# Patient Record
Sex: Female | Born: 1965 | Race: White | Hispanic: No | Marital: Married | Smoking: Never smoker
Health system: Southern US, Community
[De-identification: ages and names within clinical notes are randomized; demographics above are authoritative.]

## PROBLEM LIST (undated history)

## (undated) DIAGNOSIS — F32A Depression, unspecified: Secondary | ICD-10-CM

## (undated) DIAGNOSIS — M1990 Unspecified osteoarthritis, unspecified site: Secondary | ICD-10-CM

## (undated) DIAGNOSIS — K219 Gastro-esophageal reflux disease without esophagitis: Secondary | ICD-10-CM

## (undated) DIAGNOSIS — M353 Polymyalgia rheumatica: Secondary | ICD-10-CM

## (undated) DIAGNOSIS — R519 Headache, unspecified: Secondary | ICD-10-CM

## (undated) DIAGNOSIS — R51 Headache: Secondary | ICD-10-CM

## (undated) DIAGNOSIS — F329 Major depressive disorder, single episode, unspecified: Secondary | ICD-10-CM

## (undated) HISTORY — PX: OTHER SURGICAL HISTORY: SHX169

## (undated) HISTORY — PX: ABDOMINAL HYSTERECTOMY: SHX81

## (undated) HISTORY — PX: CHOLECYSTECTOMY: SHX55

---

## 1998-01-20 ENCOUNTER — Other Ambulatory Visit: Admission: RE | Admit: 1998-01-20 | Discharge: 1998-01-20 | Payer: Self-pay | Admitting: *Deleted

## 1998-12-29 ENCOUNTER — Other Ambulatory Visit: Admission: RE | Admit: 1998-12-29 | Discharge: 1998-12-29 | Payer: Self-pay | Admitting: *Deleted

## 2002-07-25 ENCOUNTER — Other Ambulatory Visit: Admission: RE | Admit: 2002-07-25 | Discharge: 2002-07-25 | Payer: Self-pay | Admitting: Obstetrics and Gynecology

## 2005-02-09 ENCOUNTER — Ambulatory Visit (HOSPITAL_COMMUNITY): Admission: RE | Admit: 2005-02-09 | Discharge: 2005-02-09 | Payer: Self-pay | Admitting: Family Medicine

## 2005-02-12 ENCOUNTER — Ambulatory Visit (HOSPITAL_COMMUNITY): Admission: RE | Admit: 2005-02-12 | Discharge: 2005-02-12 | Payer: Self-pay | Admitting: Family Medicine

## 2007-05-19 ENCOUNTER — Ambulatory Visit (HOSPITAL_COMMUNITY): Admission: RE | Admit: 2007-05-19 | Discharge: 2007-05-19 | Payer: Self-pay | Admitting: Obstetrics & Gynecology

## 2007-08-13 ENCOUNTER — Emergency Department (HOSPITAL_COMMUNITY): Admission: EM | Admit: 2007-08-13 | Discharge: 2007-08-13 | Payer: Self-pay | Admitting: Emergency Medicine

## 2009-02-06 ENCOUNTER — Encounter: Admission: RE | Admit: 2009-02-06 | Discharge: 2009-02-06 | Payer: Self-pay | Admitting: Family Medicine

## 2009-02-11 ENCOUNTER — Encounter: Admission: RE | Admit: 2009-02-11 | Discharge: 2009-02-11 | Payer: Self-pay | Admitting: Family Medicine

## 2009-03-12 ENCOUNTER — Emergency Department (HOSPITAL_COMMUNITY): Admission: EM | Admit: 2009-03-12 | Discharge: 2009-03-13 | Payer: Self-pay | Admitting: Emergency Medicine

## 2009-03-25 ENCOUNTER — Ambulatory Visit (HOSPITAL_COMMUNITY): Admission: RE | Admit: 2009-03-25 | Discharge: 2009-03-25 | Payer: Self-pay | Admitting: Surgery

## 2010-08-05 LAB — CBC
HCT: 38.1 % (ref 36.0–46.0)
HCT: 35.1 % — ABNORMAL LOW (ref 36.0–46.0)
Hemoglobin: 12.8 g/dL (ref 12.0–15.0)
Hemoglobin: 11.8 g/dL — ABNORMAL LOW (ref 12.0–15.0)
MCHC: 33.6 g/dL (ref 30.0–36.0)
MCHC: 33.5 g/dL (ref 30.0–36.0)
MCV: 84 fL (ref 78.0–100.0)
MCV: 84.1 fL (ref 78.0–100.0)
Platelets: 279 10*3/uL (ref 150–400)
Platelets: 253 10*3/uL (ref 150–400)
RBC: 4.53 MIL/uL (ref 3.87–5.11)
RBC: 4.18 MIL/uL (ref 3.87–5.11)
RDW: 13.3 % (ref 11.5–15.5)
RDW: 13.5 % (ref 11.5–15.5)
WBC: 8.7 10*3/uL (ref 4.0–10.5)
WBC: 8.6 10*3/uL (ref 4.0–10.5)

## 2010-08-05 LAB — COMPREHENSIVE METABOLIC PANEL
ALT: 43 U/L — ABNORMAL HIGH (ref 0–35)
ALT: 35 U/L (ref 0–35)
AST: 19 U/L (ref 0–37)
AST: 64 U/L — ABNORMAL HIGH (ref 0–37)
Albumin: 3.6 g/dL (ref 3.5–5.2)
Albumin: 3.1 g/dL — ABNORMAL LOW (ref 3.5–5.2)
Alkaline Phosphatase: 87 U/L (ref 39–117)
Alkaline Phosphatase: 69 U/L (ref 39–117)
BUN: 13 mg/dL (ref 6–23)
BUN: 9 mg/dL (ref 6–23)
CO2: 26 mEq/L (ref 19–32)
CO2: 28 mEq/L (ref 19–32)
Calcium: 9.4 mg/dL (ref 8.4–10.5)
Calcium: 8.3 mg/dL — ABNORMAL LOW (ref 8.4–10.5)
Chloride: 102 mEq/L (ref 96–112)
Chloride: 103 mEq/L (ref 96–112)
Creatinine, Ser: 0.64 mg/dL (ref 0.4–1.2)
Creatinine, Ser: 0.69 mg/dL (ref 0.4–1.2)
GFR calc Af Amer: >60 mL/min (ref >60)
GFR calc Af Amer: >60 mL/min (ref >60)
GFR calc non Af Amer: >60 mL/min (ref >60)
GFR calc non Af Amer: >60 mL/min (ref >60)
Glucose, Bld: 97 mg/dL (ref 70–99)
Glucose, Bld: 110 mg/dL — ABNORMAL HIGH (ref 70–99)
Potassium: 5.1 mEq/L (ref 3.5–5.1)
Potassium: 4.8 mEq/L (ref 3.5–5.1)
Sodium: 135 mEq/L (ref 135–145)
Sodium: 135 mEq/L (ref 135–145)
Total Bilirubin: 0.6 mg/dL (ref 0.3–1.2)
Total Bilirubin: 0.6 mg/dL (ref 0.3–1.2)
Total Protein: 7.3 g/dL (ref 6.0–8.3)
Total Protein: 6.9 g/dL (ref 6.0–8.3)

## 2010-08-05 LAB — DIFFERENTIAL
Basophils Absolute: 0 10*3/uL (ref 0.0–0.1)
Basophils Absolute: 0 10*3/uL (ref 0.0–0.1)
Basophils Relative: 0 % (ref 0–1)
Basophils Relative: 0 % (ref 0–1)
Eosinophils Absolute: 0.1 10*3/uL (ref 0.0–0.7)
Eosinophils Absolute: 0.1 10*3/uL (ref 0.0–0.7)
Eosinophils Relative: 2 % (ref 0–5)
Eosinophils Relative: 1 % (ref 0–5)
Lymphocytes Relative: 30 % (ref 12–46)
Lymphocytes Relative: 26 % (ref 12–46)
Lymphs Abs: 2.6 10*3/uL (ref 0.7–4.0)
Lymphs Abs: 2.2 10*3/uL (ref 0.7–4.0)
Monocytes Absolute: 0.5 10*3/uL (ref 0.1–1.0)
Monocytes Absolute: 0.6 10*3/uL (ref 0.1–1.0)
Monocytes Relative: 6 % (ref 3–12)
Monocytes Relative: 7 % (ref 3–12)
Neutro Abs: 5.5 10*3/uL (ref 1.7–7.7)
Neutro Abs: 5.6 10*3/uL (ref 1.7–7.7)
Neutrophils Relative %: 63 % (ref 43–77)
Neutrophils Relative %: 66 % (ref 43–77)

## 2010-08-05 LAB — LIPASE, BLOOD: Lipase: 18 U/L (ref 11–59)

## 2010-08-05 LAB — POCT CARDIAC MARKERS
CKMB, poc: 1.3 ng/mL (ref 1.0–8.0)
Myoglobin, poc: 86.2 ng/mL (ref 12–200)
Troponin i, poc: <0.05 ng/mL (ref 0.00–0.09)

## 2011-01-26 LAB — CBC
HCT: 37.5
Hemoglobin: 12.7
MCHC: 33.8
MCV: 83.5
Platelets: 247
RBC: 4.49
RDW: 13.1
WBC: 8.6

## 2011-01-26 LAB — POCT CARDIAC MARKERS
CKMB, poc: <1 — ABNORMAL LOW
Myoglobin, poc: 51.1
Myoglobin, poc: 68.4
Operator id: 257131
Troponin i, poc: <0.05

## 2011-01-26 LAB — POCT I-STAT, CHEM 8
BUN: 13
Calcium, Ion: 1.21
Chloride: 102
Creatinine, Ser: 0.9
Glucose, Bld: 103 — ABNORMAL HIGH
HCT: 40
Hemoglobin: 13.6
Potassium: 3.8
Sodium: 137
TCO2: 28

## 2011-04-09 ENCOUNTER — Other Ambulatory Visit: Payer: Self-pay | Admitting: Family Medicine

## 2011-04-09 DIAGNOSIS — N644 Mastodynia: Secondary | ICD-10-CM

## 2011-04-22 ENCOUNTER — Ambulatory Visit
Admission: RE | Admit: 2011-04-22 | Discharge: 2011-04-22 | Disposition: A | Discharge status: Home / Self Care | Payer: BC Managed Care – PPO | Source: Ambulatory Visit | Attending: Family Medicine | Admitting: Family Medicine

## 2011-04-22 DIAGNOSIS — N644 Mastodynia: Secondary | ICD-10-CM

## 2011-10-07 ENCOUNTER — Other Ambulatory Visit: Payer: Self-pay | Admitting: Orthopaedic Surgery

## 2011-10-07 DIAGNOSIS — M542 Cervicalgia: Secondary | ICD-10-CM

## 2011-10-07 DIAGNOSIS — M549 Dorsalgia, unspecified: Secondary | ICD-10-CM

## 2011-10-09 ENCOUNTER — Ambulatory Visit
Admission: RE | Admit: 2011-10-09 | Discharge: 2011-10-09 | Disposition: A | Discharge status: Home / Self Care | Payer: BC Managed Care – PPO | Source: Ambulatory Visit | Attending: Orthopaedic Surgery | Admitting: Orthopaedic Surgery

## 2011-10-09 DIAGNOSIS — M549 Dorsalgia, unspecified: Secondary | ICD-10-CM

## 2011-10-09 DIAGNOSIS — M542 Cervicalgia: Secondary | ICD-10-CM

## 2013-01-21 ENCOUNTER — Encounter (HOSPITAL_COMMUNITY): Payer: Self-pay

## 2013-01-21 ENCOUNTER — Emergency Department (HOSPITAL_COMMUNITY)
Admission: EM | Admit: 2013-01-21 | Discharge: 2013-01-21 | Disposition: A | Discharge status: Home / Self Care | Payer: BC Managed Care – PPO | Attending: Emergency Medicine | Admitting: Emergency Medicine

## 2013-01-21 DIAGNOSIS — F3289 Other specified depressive episodes: Secondary | ICD-10-CM | POA: Insufficient documentation

## 2013-01-21 DIAGNOSIS — F329 Major depressive disorder, single episode, unspecified: Secondary | ICD-10-CM

## 2013-01-21 DIAGNOSIS — M353 Polymyalgia rheumatica: Secondary | ICD-10-CM | POA: Insufficient documentation

## 2013-01-21 HISTORY — DX: Depression, unspecified: F32.A

## 2013-01-21 HISTORY — DX: Major depressive disorder, single episode, unspecified: F32.9

## 2013-01-21 HISTORY — DX: Polymyalgia rheumatica: M35.3

## 2013-01-21 LAB — COMPREHENSIVE METABOLIC PANEL
ALT: 11 U/L (ref 0–35)
Albumin: 3.4 g/dL — ABNORMAL LOW (ref 3.5–5.2)
Alkaline Phosphatase: 85 U/L (ref 39–117)
Potassium: 3.8 mEq/L (ref 3.5–5.1)
Sodium: 137 mEq/L (ref 135–145)
Total Protein: 8.3 g/dL (ref 6.0–8.3)

## 2013-01-21 LAB — RAPID URINE DRUG SCREEN, HOSP PERFORMED
Amphetamines: POSITIVE — AB
Barbiturates: NOT DETECTED
Tetrahydrocannabinol: NOT DETECTED

## 2013-01-21 LAB — CBC WITH DIFFERENTIAL/PLATELET
Basophils Relative: 0 % (ref 0–1)
Eosinophils Absolute: 0.1 10*3/uL (ref 0.0–0.7)
Lymphs Abs: 2.2 10*3/uL (ref 0.7–4.0)
MCH: 26.8 pg (ref 26.0–34.0)
Neutro Abs: 6.5 10*3/uL (ref 1.7–7.7)
Neutrophils Relative %: 71 % (ref 43–77)
Platelets: 244 10*3/uL (ref 150–400)
RBC: 4.66 MIL/uL (ref 3.87–5.11)

## 2013-01-21 LAB — ETHANOL: Alcohol, Ethyl (B): <11 mg/dL (ref 0–11)

## 2013-01-21 MED ORDER — ONDANSETRON HCL 4 MG PO TABS: 4.0000 mg | ORAL_TABLET | Freq: Three times a day (TID) | ORAL | Status: DC | PRN

## 2013-01-21 MED ORDER — IBUPROFEN 200 MG PO TABS: 600.0000 mg | ORAL_TABLET | Freq: Three times a day (TID) | ORAL | Status: DC | PRN

## 2013-01-21 MED ORDER — ZOLPIDEM TARTRATE 5 MG PO TABS: 5.0000 mg | ORAL_TABLET | Freq: Every evening | ORAL | Status: DC | PRN

## 2013-01-21 MED ORDER — ALUM & MAG HYDROXIDE-SIMETH 200-200-20 MG/5ML PO SUSP: 30.0000 mL | ORAL | Status: DC | PRN

## 2013-01-21 MED ORDER — ACETAMINOPHEN 325 MG PO TABS: 650.0000 mg | ORAL_TABLET | ORAL | Status: DC | PRN

## 2013-01-21 MED ORDER — NICOTINE 21 MG/24HR TD PT24: 21.0000 mg | MEDICATED_PATCH | Freq: Every day | TRANSDERMAL | Status: DC

## 2013-01-21 NOTE — ED Provider Notes (Addendum)
CSN: [id number]     Arrival date & time 01/21/13  1706 History  This chart was scribed for Hannah Pel, PA, working with Hannah Most B. Bernette Mayers, MD by Blanchard Kelch, ED Scribe. This patient was seen in room [facility] and the patient's care was started at 6:47 PM.    Chief Complaint  Patient presents with  . Mental Health Problem    The history is provided by the patient. No language interpreter was used.    HPI Comments: Hannah Lin is a 47 y.o. female who presents to the Emergency Department complaining of moderate, intermittent chronic depression. She states that the depression is due to family issues. She denies any suicidal or homicidal ideations. She denies any alcohol or drug use. She has a past medical history of polymyalgia rheumatica. She was on medication for it but stopped taking it because she believes it caused her to gain weight. She currently takes Adderall.    Past Medical History  Diagnosis Date  . Polymyalgia rheumatica   . Depressed    Past Surgical History  Procedure Laterality Date  . Cholecystectomy     History reviewed. No pertinent family history. History  Substance Use Topics  . Smoking status: Never Smoker   . Smokeless tobacco: Not on file  . Alcohol Use: No   OB History   Grav Para Term Preterm Abortions TAB SAB Ect Mult Living                 Review of Systems  Psychiatric/Behavioral: Negative for suicidal ideas.       Positive for depression  All other systems reviewed and are negative.    Allergies  Sulfa antibiotics  Home Medications  No current outpatient prescriptions on file. Triage Vitals: BP 116/67  Pulse 82  Temp(Src) 98.3 F (36.8 C) (Oral)  Resp 18  SpO2 100%  Physical Exam  Nursing note and vitals reviewed. Constitutional: She is oriented to person, place, and time. She appears well-developed and well-nourished. No distress.  HENT:  Head: Normocephalic and atraumatic.  Eyes: EOM are normal.  Neck: Neck supple. No  tracheal deviation present.  Cardiovascular: Normal rate, regular rhythm and normal heart sounds.   Pulmonary/Chest: Effort normal and breath sounds normal. No respiratory distress.  Musculoskeletal: Normal range of motion.  Neurological: She is alert and oriented to person, place, and time.  Skin: Skin is warm and dry.  Psychiatric: She has a normal mood and affect. Her behavior is normal.    ED Course  Procedures (including critical care time)  DIAGNOSTIC STUDIES: Oxygen Saturation is 100% on room air, normal by my interpretation.    COORDINATION OF CARE:  6:51 PM - Patient verbalizes understanding and agrees with treatment plan.   Labs Review Labs Reviewed  COMPREHENSIVE METABOLIC PANEL - Abnormal; Notable for the following:    Glucose, Bld 124 (*)    Albumin 3.4 (*)    Total Bilirubin 0.2 (*)    All other components within normal limits  CBC WITH DIFFERENTIAL  ETHANOL  URINE RAPID DRUG SCREEN (HOSP PERFORMED)   Imaging Review No results found.  MDM   1. Depression    TTS consulted to  evaluate patient. Labs ordered  Patient found to be safe to go home today from the ER. There were concerns about pt now admitting because of family members encouragement that she had taken 10 x 0.25mg  Clonazepam. Pt admits to this and denies taking anything else today that was irregular. She said she took  them all through out the day and ate with it as well. She admits that sometimes she will take 4 or 5 at a time for her nerves, and never has an adverse reaction. I explained to her that this is dangerous. She says he understands. Adamantly endorses she does not want to harm herself. Vital signs are stable. Pt is awake and alert and able to talk. TTS agrees patient can go home from psych perspective. Dr. Bernette Lin and myself agree.  47 y.o.Kyani J Lin's evaluation in the Emergency Department is complete. It has been determined that no acute conditions requiring further emergency  intervention are present at this time. The patient/guardian have been advised of the diagnosis and plan. We have discussed signs and symptoms that warrant return to the ED, such as changes or worsening in symptoms.  Vital signs are stable at discharge. Filed Vitals:   01/21/13 1855  BP: 101/67  Pulse: 69  Temp: 98 F (36.7 C)  Resp: 17    Patient/guardian has voiced understanding and agreed to follow-up with the PCP or specialist.   I personally performed the services described in this documentation, which was scribed in my presence. The recorded information has been reviewed and is accurate.    [name], PA-C 01/21/13 1903  [name], PA-C 01/21/13 2023

## 2013-01-21 NOTE — BH Assessment (Signed)
Assessment Note  Hannah Lin is an 47 y.o. female. Pt comes to [facility] on advice of her friend due to depression and an issue with her husband last night.  Husband reportedly drinks too much, argued with pt last night and then didn't come home.  Husband showed up today with a story about another woman and missing some money.  Pt also has pain issues that she deals with on a daily basis.  Pt admits to depression for some time.  Pt has tried anti depressant medication through PCP in the past, not currently taking anything.  Pt denies any other psych treatment.  Pt denies any SI/HIAV.  Pt did not mention that she had taken 10 clonazepam prior to coming to [facility] and pt did not mention this until ACT spoke with the friend.  Pt continues to deny any suicidal intent, states she took the pills to get some rest.  Pt reports 3 hours of sleep per night for quite some time.    Axis I: Depressive Disorder NOS Axis II: Deferred Axis III:  Past Medical History  Diagnosis Date  . Polymyalgia rheumatica   . Depressed    Axis IV: problems with primary support group Axis V: 51-60 moderate symptoms  Past Medical History:  Past Medical History  Diagnosis Date  . Polymyalgia rheumatica   . Depressed     Past Surgical History  Procedure Laterality Date  . Cholecystectomy      Family History: History reviewed. No pertinent family history.  Social History:  reports that she has never smoked. She does not have any smokeless tobacco history on file. She reports that she does not drink alcohol. Her drug history is not on file.  Additional Social History:  Alcohol / Drug Use Pain Medications: Pt denies.  BAC negative.  UDS + benzo and amphetamines. History of alcohol / drug use?: No history of alcohol / drug abuse  CIWA: CIWA-Ar BP: 101/67 mmHg Pulse Rate: 69 COWS:    Allergies:  Allergies  Allergen Reactions  . Sulfa Antibiotics Hives    Home Medications:  (Not in a hospital admission)  OB/GYN  Status:  No LMP recorded. Patient has had a hysterectomy.  General Assessment Data Location of Assessment: [facility] ED ACT Assessment: Yes Is this a Tele or Face-to-Face Assessment?: Face-to-Face Is this an Initial Assessment or a Re-assessment for this encounter?: Initial Assessment Living Arrangements: Spouse/significant other Can pt return to current living arrangement?: Yes     [facility] Crisis Care Plan Living Arrangements: Spouse/significant other Name of Psychiatrist: none Name of Therapist: none     Risk to self Suicidal Ideation: No Suicidal Intent: No Is patient at risk for suicide?: No Suicidal Plan?: No Access to Means: No What has been your use of drugs/alcohol within the last 12 months?: denies use Previous Attempts/Gestures: No Intentional Self Injurious Behavior: None Family Suicide History: No Recent stressful life event(s): Conflict (Comment) (with spouse, medical/pain issues) Persecutory voices/beliefs?: No Depression: Yes Depression Symptoms: Despondent;Insomnia;Tearfulness;Isolating;Fatigue;Guilt;Loss of interest in usual pleasures;Feeling worthless/self pity Substance abuse history and/or treatment for substance abuse?: No Suicide prevention information given to non-admitted patients: Yes  Risk to Others Homicidal Ideation: No Thoughts of Harm to Others: No Current Homicidal Intent: No Current Homicidal Plan: No Access to Homicidal Means: No History of harm to others?: No Assessment of Violence: None Noted Does patient have access to weapons?: No Criminal Charges Pending?: No Does patient have a court date: No  Psychosis Hallucinations: None noted Delusions: None noted  Mental Status Report Appear/Hygiene: Disheveled Eye Contact: Fair Motor Activity: Unremarkable Speech: Logical/coherent Level of Consciousness: Alert Mood: Other (Comment) (pleasant, cooperative) Affect: Appropriate to circumstance Anxiety Level: Minimal Thought Processes:  Coherent;Relevant Judgement: Unimpaired Orientation: Person;Place;Time;Situation Obsessive Compulsive Thoughts/Behaviors: None  Cognitive Functioning Concentration: Normal Memory: Recent Intact;Remote Intact IQ: Average Insight: Good Impulse Control: Fair Appetite: Good Weight Loss: 0 Weight Gain: 60 (in past 6 months) Sleep: No Change Total Hours of Sleep: 3 (long term sleep issues) Vegetative Symptoms: None  ADLScreening [facility] Assessment Services) Patient's cognitive ability adequate to safely complete daily activities?: Yes Patient able to express need for assistance with ADLs?: Yes Independently performs ADLs?: Yes (appropriate for developmental age)  Prior Inpatient Therapy Prior Inpatient Therapy: No  Prior Outpatient Therapy Prior Outpatient Therapy: No  ADL Screening (condition at time of admission) Patient's cognitive ability adequate to safely complete daily activities?: Yes Patient able to express need for assistance with ADLs?: Yes Independently performs ADLs?: Yes (appropriate for developmental age)       Abuse/Neglect Assessment (Assessment to be complete while patient is alone) Physical Abuse: Denies Verbal Abuse: Denies Sexual Abuse: Denies Exploitation of patient/patient's resources: Denies Self-Neglect: Denies Values / Beliefs Cultural Requests During Hospitalization: None Spiritual Requests During Hospitalization: None   Advance Directives (For Healthcare) Advance Directive: Patient does not have advance directive;Patient would not like information    Additional Information 1:1 In Past 12 Months?: No CIRT Risk: No Elopement Risk: No Does patient have medical clearance?: Yes     Disposition: Discussed this pt with Dr Effie Shy and then with PA, who will initiate further medical clearance at this time.  Prior to this, had discussed outpt treatment options with pt, who was amendable to pursuing outpt treatment for her  depression. Disposition Initial Assessment Completed for this Encounter: Yes  On Site Evaluation by:   Reviewed with Physician:    Lorri Frederick 01/21/2013 8:04 PM

## 2013-01-21 NOTE — ED Notes (Signed)
Patient presents slightly lethargic depressed mood and affect congruent to mood. Patient currently denies suicidality, homicidality or AVH. Patient admits to increased depression today after discovering that her husband gave money to a young woman who "shouldn't even have his number" money. Patient states that she feels neglected because her husband does not spend time at home and is now giving his time to someone else. She admits to taking approximately 10 tabs of her Klonopin from 0900 this AM until 1630 this evening. Patient states that she knows that it was stupid and that she believes in God and would never kill herself. She would never want her children to blame God or her husband for her death. Patient denies any current pain.

## 2013-01-21 NOTE — Progress Notes (Signed)
Admission is incomplete. Report was given to [facility]. Pt  Is a voluntary admission and denies any si/hi/av. She states she is depressed. Contributing factors are a alcoholic husband and a schizophrenic son. She was given fluids, refused any food and is resting in her room. She had no complaints of pain. Oncoming RN will finish the admission.

## 2013-01-21 NOTE — ED Notes (Signed)
I have just phoned report to [facility], Charity fundraiser in psych. E.D. And will transport her there shortly.

## 2013-01-21 NOTE — ED Provider Notes (Signed)
Medical screening examination/treatment/procedure(s) were performed by non-physician practitioner and as supervising physician I was immediately available for consultation/collaboration.   Charles B. Sheldon, MD 01/21/13 1929 

## 2013-01-21 NOTE — ED Notes (Signed)
She c/o being "very depressed for a long time now".  "I can't get any sleep--I only get about 2 hours of sleep a night".  She is in no distress.

## 2014-10-11 ENCOUNTER — Inpatient Hospital Stay (HOSPITAL_COMMUNITY)
Admission: EM | Admit: 2014-10-11 | Discharge: 2014-10-13 | DRG: 439 | Disposition: A | Discharge status: Home / Self Care | Payer: BLUE CROSS/BLUE SHIELD | Attending: Internal Medicine | Admitting: Internal Medicine

## 2014-10-11 ENCOUNTER — Emergency Department (HOSPITAL_COMMUNITY): Payer: BLUE CROSS/BLUE SHIELD

## 2014-10-11 ENCOUNTER — Encounter (HOSPITAL_COMMUNITY): Payer: Self-pay | Admitting: *Deleted

## 2014-10-11 DIAGNOSIS — Z6841 Body Mass Index (BMI) 40.0 and over, adult: Secondary | ICD-10-CM

## 2014-10-11 DIAGNOSIS — R1010 Upper abdominal pain, unspecified: Secondary | ICD-10-CM | POA: Diagnosis not present

## 2014-10-11 DIAGNOSIS — R52 Pain, unspecified: Secondary | ICD-10-CM | POA: Insufficient documentation

## 2014-10-11 DIAGNOSIS — K859 Acute pancreatitis without necrosis or infection, unspecified: Secondary | ICD-10-CM | POA: Diagnosis present

## 2014-10-11 DIAGNOSIS — Z9049 Acquired absence of other specified parts of digestive tract: Secondary | ICD-10-CM | POA: Diagnosis present

## 2014-10-11 DIAGNOSIS — M353 Polymyalgia rheumatica: Secondary | ICD-10-CM | POA: Diagnosis present

## 2014-10-11 LAB — CBC WITH DIFFERENTIAL/PLATELET
Basophils Absolute: 0 10*3/uL (ref 0.0–0.1)
Basophils Relative: 0 % (ref 0–1)
EOS ABS: 0.1 10*3/uL (ref 0.0–0.7)
EOS PCT: 1 % (ref 0–5)
HEMATOCRIT: 36.5 % (ref 36.0–46.0)
Hemoglobin: 11.4 g/dL — ABNORMAL LOW (ref 12.0–15.0)
LYMPHS ABS: 2.6 10*3/uL (ref 0.7–4.0)
LYMPHS PCT: 30 % (ref 12–46)
MCH: 26.9 pg (ref 26.0–34.0)
MCHC: 31.2 g/dL (ref 30.0–36.0)
MCV: 86.1 fL (ref 78.0–100.0)
MONO ABS: 0.5 10*3/uL (ref 0.1–1.0)
Monocytes Relative: 6 % (ref 3–12)
NEUTROS ABS: 5.6 10*3/uL (ref 1.7–7.7)
NEUTROS PCT: 63 % (ref 43–77)
Platelets: 268 10*3/uL (ref 150–400)
RBC: 4.24 MIL/uL (ref 3.87–5.11)
RDW: 14.3 % (ref 11.5–15.5)
WBC: 8.9 10*3/uL (ref 4.0–10.5)

## 2014-10-11 LAB — COMPREHENSIVE METABOLIC PANEL
ALT: 100 U/L — AB (ref 14–54)
AST: 179 U/L — AB (ref 15–41)
Albumin: 3.5 g/dL (ref 3.5–5.0)
Alkaline Phosphatase: 103 U/L (ref 38–126)
Anion gap: 8 (ref 5–15)
BUN: 20 mg/dL (ref 6–20)
CHLORIDE: 101 mmol/L (ref 101–111)
CO2: 29 mmol/L (ref 22–32)
Calcium: 8.9 mg/dL (ref 8.9–10.3)
Creatinine, Ser: 0.74 mg/dL (ref 0.44–1.00)
GFR calc non Af Amer: >60 mL/min (ref >60)
Glucose, Bld: 118 mg/dL — ABNORMAL HIGH (ref 65–99)
Potassium: 4.5 mmol/L (ref 3.5–5.1)
SODIUM: 138 mmol/L (ref 135–145)
TOTAL PROTEIN: 7.7 g/dL (ref 6.5–8.1)
Total Bilirubin: 1 mg/dL (ref 0.3–1.2)

## 2014-10-11 LAB — LIPASE, BLOOD: Lipase: >3000 U/L — ABNORMAL HIGH (ref 22–51)

## 2014-10-11 LAB — TROPONIN I: Troponin I: <0.03 ng/mL (ref <0.031)

## 2014-10-11 MED ORDER — IOHEXOL 300 MG/ML  SOLN
150.0000 mL | Freq: Once | INTRAMUSCULAR | Status: AC | PRN
  Administered 2014-10-11: 125 mL via INTRAVENOUS

## 2014-10-11 MED ORDER — IOHEXOL 300 MG/ML  SOLN
25.0000 mL | Freq: Once | INTRAMUSCULAR | Status: AC | PRN
  Administered 2014-10-11: 25 mL via ORAL

## 2014-10-11 MED ORDER — SODIUM CHLORIDE 0.9 % IV BOLUS (SEPSIS)
1000.0000 mL | Freq: Once | INTRAVENOUS | Status: AC
  Administered 2014-10-11: 1000 mL via INTRAVENOUS

## 2014-10-11 MED ORDER — HYDROMORPHONE HCL 1 MG/ML IJ SOLN
1.0000 mg | Freq: Once | INTRAMUSCULAR | Status: AC
  Administered 2014-10-11: 1 mg via INTRAVENOUS
  Filled 2014-10-11: qty 1

## 2014-10-11 MED ORDER — GI COCKTAIL ~~LOC~~
30.0000 mL | Freq: Once | ORAL | Status: AC
  Administered 2014-10-11: 30 mL via ORAL
  Filled 2014-10-11: qty 30

## 2014-10-11 NOTE — ED Provider Notes (Signed)
CSN: [id number]     Arrival date & time 10/11/14  1707 History  This chart was scribed for Hannah Berkshire, MD by Karle Plumber, ED Scribe. This patient was seen in room APA02/[facility] and the patient's care was started at 8:06 PM.  Chief Complaint  Patient presents with  . Chest Pain   Patient is a 49 y.o. female presenting with abdominal pain. The history is provided by the patient and medical records (the pt complains of abd pain for over a year.  worse with in the last week.). No language interpreter was used.  Abdominal Pain Pain location:  Epigastric Pain quality: not aching   Pain radiates to:  Epigastric region Pain severity:  Moderate Onset quality:  Gradual Timing:  Constant Progression:  Unchanged Chronicity:  New Context: not alcohol use   Associated symptoms: chest pain   Associated symptoms: no cough, no diarrhea, no fatigue and no hematuria     HPI Comments:  Hannah Lin is a 49 y.o. obese female with past surgical h/o cholecystectomy who presents to the Emergency Department complaining of severe upper abdominal pain that has been ongoing for "several years". She states the pain starts centrally and radiates upwards and into her back. She reports tightening around her ribs and pain into her back as well. She states she has fallen three times in the past two weeks. The first time she reports falling flat on her stomach and the second two times she reports falling on concrete. She denies being unsteady on her feet. She denies any alleviating factors of the abdominal pain but reports it worsens when she drinks milk. Pt states she has spoken with her PCP about the pain but has never been referred to a specialist or had a colonoscopy. She denies diarrhea or dizziness.   Past Medical History  Diagnosis Date  . Polymyalgia rheumatica   . Depressed    Past Surgical History  Procedure Laterality Date  . Cholecystectomy     No family history on file. History  Substance Use  Topics  . Smoking status: Never Smoker   . Smokeless tobacco: Not on file  . Alcohol Use: No   OB History    No data available     Review of Systems  Constitutional: Negative for appetite change and fatigue.  HENT: Negative for congestion, ear discharge and sinus pressure.   Eyes: Negative for discharge.  Respiratory: Negative for cough.   Cardiovascular: Positive for chest pain.  Gastrointestinal: Positive for abdominal pain. Negative for diarrhea.  Genitourinary: Negative for frequency and hematuria.  Musculoskeletal: Negative for back pain.  Skin: Negative for rash.  Neurological: Negative for dizziness, seizures and headaches.  Psychiatric/Behavioral: Negative for hallucinations.    Allergies  Sulfa antibiotics  Home Medications   Prior to Admission medications   Not on File   Triage Vitals: BP 125/67 mmHg  Pulse 95  Temp(Src) 98.5 F (36.9 C) (Oral)  Resp 18  Ht 5\' 6"  (1.676 m)  Wt 275 lb (124.739 kg)  BMI 44.41 kg/m2  SpO2 99% Physical Exam  Constitutional: She is oriented to person, place, and time. She appears well-developed.  HENT:  Head: Normocephalic.  Eyes: Conjunctivae and EOM are normal. No scleral icterus.  Neck: Neck supple. No thyromegaly present.  Cardiovascular: Normal rate and regular rhythm.  Exam reveals no gallop and no friction rub.   No murmur heard. Pulmonary/Chest: No stridor. She has no wheezes. She has no rales. She exhibits no tenderness.  Abdominal: Soft.  She exhibits no distension. There is tenderness (Moderate epigastric tenderness). There is no rebound.  Tender epigastric area  Musculoskeletal: Normal range of motion. She exhibits no edema.  Lymphadenopathy:    She has no cervical adenopathy.  Neurological: She is oriented to person, place, and time. She exhibits normal muscle tone. Coordination normal.  Skin: No rash noted. No erythema.  Psychiatric: She has a normal mood and affect. Her behavior is normal.    ED Course   Procedures (including critical care time) DIAGNOSTIC STUDIES: Oxygen Saturation is 99% on RA, normal by my interpretation.   COORDINATION OF CARE: 8:11 PM- Will order imaging. Pt verbalizes understanding and agrees to plan.  Medications - No data to display  Labs Review Labs Reviewed - No data to display  Imaging Review No results found.   EKG Interpretation   Date/Time:  Friday October 11 2014 17:17:22 EDT Ventricular Rate:  92 PR Interval:  152 QRS Duration: 84 QT Interval:  352 QTC Calculation: 435 R Axis:   56 Text Interpretation:  Normal sinus rhythm Normal ECG Confirmed by Winnie Barsky   MD, Nicie Milan [phone]) on 10/11/2014 8:01:06 PM      MDM   Final diagnoses:  None   pancreatitis  The chart was scribed for me under my direct supervision.  I personally performed the history, physical, and medical decision making and all procedures in the evaluation of this patient..  I personally performed the services described in this documentation, which was scribed in my presence. The recorded information has been reviewed and is accurate.    Hannah Berkshire, MD 10/11/14 2356

## 2014-10-11 NOTE — ED Notes (Signed)
Chest pain in epigastric region radiating into upper chest for over a year

## 2014-10-11 NOTE — ED Notes (Signed)
   10/11/14 1946  Abdominal  Gastrointestinal ([facility]) X  Abdomen Inspection Soft;Obese  Tenderness Nontender  Last Bowel Movement Date 10/10/14  Pt states on & off pain to her upper abdomen that come up to the right shoulder & back. Pt has become more consistent today. Pt states she has fallen 2 times over the past 2 weeks landing on her abdomen.

## 2014-10-11 NOTE — ED Notes (Addendum)
Pt states on & off pain to her upper abdomen that come up to the right shoulder & back. Pt has become more consistent today. Pt states she has fallen 2 times over the past 2 weeks landing on her abdomen.

## 2014-10-12 ENCOUNTER — Encounter (HOSPITAL_COMMUNITY): Payer: Self-pay | Admitting: *Deleted

## 2014-10-12 DIAGNOSIS — K859 Acute pancreatitis without necrosis or infection, unspecified: Secondary | ICD-10-CM | POA: Diagnosis present

## 2014-10-12 DIAGNOSIS — Z9049 Acquired absence of other specified parts of digestive tract: Secondary | ICD-10-CM | POA: Diagnosis present

## 2014-10-12 DIAGNOSIS — R52 Pain, unspecified: Secondary | ICD-10-CM

## 2014-10-12 DIAGNOSIS — K858 Other acute pancreatitis: Secondary | ICD-10-CM | POA: Diagnosis not present

## 2014-10-12 DIAGNOSIS — M353 Polymyalgia rheumatica: Secondary | ICD-10-CM | POA: Diagnosis present

## 2014-10-12 DIAGNOSIS — Z6841 Body Mass Index (BMI) 40.0 and over, adult: Secondary | ICD-10-CM | POA: Diagnosis not present

## 2014-10-12 DIAGNOSIS — R1010 Upper abdominal pain, unspecified: Secondary | ICD-10-CM | POA: Diagnosis present

## 2014-10-12 LAB — COMPREHENSIVE METABOLIC PANEL
ALK PHOS: 125 U/L (ref 38–126)
ALT: 169 U/L — ABNORMAL HIGH (ref 14–54)
AST: 244 U/L — ABNORMAL HIGH (ref 15–41)
Albumin: 3.2 g/dL — ABNORMAL LOW (ref 3.5–5.0)
Anion gap: 7 (ref 5–15)
BUN: 12 mg/dL (ref 6–20)
CALCIUM: 8.2 mg/dL — AB (ref 8.9–10.3)
CO2: 27 mmol/L (ref 22–32)
Chloride: 104 mmol/L (ref 101–111)
Creatinine, Ser: 0.59 mg/dL (ref 0.44–1.00)
Glucose, Bld: 101 mg/dL — ABNORMAL HIGH (ref 65–99)
POTASSIUM: 3.5 mmol/L (ref 3.5–5.1)
SODIUM: 138 mmol/L (ref 135–145)
TOTAL PROTEIN: 7.2 g/dL (ref 6.5–8.1)
Total Bilirubin: 1.3 mg/dL — ABNORMAL HIGH (ref 0.3–1.2)

## 2014-10-12 LAB — CBC WITH DIFFERENTIAL/PLATELET
Basophils Absolute: 0 10*3/uL (ref 0.0–0.1)
Basophils Relative: 0 % (ref 0–1)
Eosinophils Absolute: 0.1 10*3/uL (ref 0.0–0.7)
Eosinophils Relative: 1 % (ref 0–5)
HCT: 36.1 % (ref 36.0–46.0)
Hemoglobin: 11.1 g/dL — ABNORMAL LOW (ref 12.0–15.0)
LYMPHS PCT: 31 % (ref 12–46)
Lymphs Abs: 2.5 10*3/uL (ref 0.7–4.0)
MCH: 26.4 pg (ref 26.0–34.0)
MCHC: 30.7 g/dL (ref 30.0–36.0)
MCV: 86 fL (ref 78.0–100.0)
Monocytes Absolute: 0.4 10*3/uL (ref 0.1–1.0)
Monocytes Relative: 5 % (ref 3–12)
Neutro Abs: 5.2 10*3/uL (ref 1.7–7.7)
Neutrophils Relative %: 63 % (ref 43–77)
Platelets: 267 10*3/uL (ref 150–400)
RBC: 4.2 MIL/uL (ref 3.87–5.11)
RDW: 14.3 % (ref 11.5–15.5)
WBC: 8.2 10*3/uL (ref 4.0–10.5)

## 2014-10-12 LAB — LIPID PANEL
Cholesterol: 206 mg/dL — ABNORMAL HIGH (ref 0–200)
HDL: 60 mg/dL (ref >40)
LDL CALC: 123 mg/dL — AB (ref 0–99)
TRIGLYCERIDES: 113 mg/dL (ref <150)
Total CHOL/HDL Ratio: 3.4 RATIO
VLDL: 23 mg/dL (ref 0–40)

## 2014-10-12 LAB — LIPASE, BLOOD: Lipase: 1585 U/L — ABNORMAL HIGH (ref 22–51)

## 2014-10-12 MED ORDER — HYDROMORPHONE HCL 1 MG/ML IJ SOLN
0.5000 mg | INTRAMUSCULAR | Status: DC | PRN
  Administered 2014-10-12 – 2014-10-13 (×6): 0.5 mg via INTRAVENOUS
  Filled 2014-10-12 (×6): qty 1

## 2014-10-12 MED ORDER — CITALOPRAM HYDROBROMIDE 20 MG PO TABS
40.0000 mg | ORAL_TABLET | Freq: Every evening | ORAL | Status: DC
  Administered 2014-10-12: 40 mg via ORAL
  Filled 2014-10-12: qty 1
  Filled 2014-10-12: qty 2
  Filled 2014-10-12: qty 1

## 2014-10-12 MED ORDER — HEPARIN SODIUM (PORCINE) 5000 UNIT/ML IJ SOLN
5000.0000 [IU] | Freq: Three times a day (TID) | INTRAMUSCULAR | Status: DC
  Administered 2014-10-12 – 2014-10-13 (×4): 5000 [IU] via SUBCUTANEOUS
  Filled 2014-10-12 (×4): qty 1

## 2014-10-12 MED ORDER — PANTOPRAZOLE SODIUM 40 MG PO TBEC
40.0000 mg | DELAYED_RELEASE_TABLET | Freq: Every day | ORAL | Status: DC
  Administered 2014-10-12 – 2014-10-13 (×2): 40 mg via ORAL
  Filled 2014-10-12 (×2): qty 1

## 2014-10-12 MED ORDER — DEXTROSE-NACL 5-0.9 % IV SOLN
INTRAVENOUS | Status: DC
  Administered 2014-10-12: 13:00:00 via INTRAVENOUS
  Administered 2014-10-12: 100 mL/h via INTRAVENOUS

## 2014-10-12 MED ORDER — GABAPENTIN 100 MG PO CAPS
100.0000 mg | ORAL_CAPSULE | Freq: Every day | ORAL | Status: DC
  Administered 2014-10-12: 100 mg via ORAL
  Filled 2014-10-12 (×2): qty 1

## 2014-10-12 MED ORDER — SODIUM CHLORIDE 0.9 % IV SOLN
INTRAVENOUS | Status: DC
  Administered 2014-10-12: 1000 mL via INTRAVENOUS

## 2014-10-12 MED ORDER — CLONAZEPAM 0.5 MG PO TABS: 0.5000 mg | ORAL_TABLET | Freq: Three times a day (TID) | ORAL | Status: DC | PRN

## 2014-10-12 MED ORDER — GABAPENTIN 100 MG PO CAPS: 100.0000 mg | ORAL_CAPSULE | Freq: Every day | ORAL | Status: DC

## 2014-10-12 MED ORDER — AMPHETAMINE-DEXTROAMPHETAMINE 10 MG PO TABS
30.0000 mg | ORAL_TABLET | Freq: Two times a day (BID) | ORAL | Status: DC
  Filled 2014-10-12 (×2): qty 3

## 2014-10-12 MED ORDER — BUPROPION HCL ER (XL) 150 MG PO TB24
150.0000 mg | ORAL_TABLET | Freq: Every evening | ORAL | Status: DC
  Administered 2014-10-12: 150 mg via ORAL
  Filled 2014-10-12 (×2): qty 1

## 2014-10-12 NOTE — H&P (Signed)
Hospitalist Admission History and Physical  Patient name: Hannah Lin Medical record number: [id number] Date of birth: Jan 02, 1966 Age: 49 y.o. Gender: female  Primary Care Provider: No primary care provider on file.  Chief Complaint: pancreatitis   History of Present Illness:This is a 49 y.o. year old female with significant past medical history of morbid obesity, polymyalgia rheumatica presenting with pancreatitis. Pt states that she has had recurring intermittent abd pain for the past 2 years. States that she eats a high fat diet. Has recurrent episode of central/epigastric adb pain that radiates to back. Pain is usually moderate. However became acutely severe earlier today. Pt reports prior hx/p cholecystectomy in the past. No fevers or chills. No nausea or vomiting.  Presented to the ER Afebrile, hemodynamically stable. CBC WNL. CMET noted for elevated LFTs with AST 179, ALT 100. Lipase >3000. CT abd and pelvis show acute pancreatitis w/ mild general soft tissue inflammation of pancreas.   Assessment and Plan: Hannah Lin is a 49 y.o. year old female presenting with pancreatitis    Active Problems:   Pancreatitis   1-Pancreatitis -suspect likely diet induced  -lipid panel  -NPO--advance diet as tolerated  -IV Fluids  -Pain control  -anti-emetics -follow   2-Mood -Stable  -Cont home regimen   FEN/GI: NPO--advance diet as tolerated  Prophylaxis: sub q heparin Disposition: pending further evaluation Code Status:Full Code    Patient Active Problem List   Diagnosis Date Noted  . Pancreatitis 10/12/2014  . Polymyalgia rheumatica   . Depressed    Past Medical History: Past Medical History  Diagnosis Date  . Polymyalgia rheumatica   . Depressed     Past Surgical History: Past Surgical History  Procedure Laterality Date  . Cholecystectomy      Social History: History   Social History  . Marital Status: Married    Spouse Name: N/A  . Number of Children:  N/A  . Years of Education: N/A   Social History Main Topics  . Smoking status: Never Smoker   . Smokeless tobacco: Not on file  . Alcohol Use: No  . Drug Use: Not on file  . Sexual Activity: Not on file   Other Topics Concern  . None   Social History Narrative    Family History: No family history on file.  Allergies: Allergies  Allergen Reactions  . Sulfa Antibiotics Hives    Current Facility-Administered Medications  Medication Dose Route Frequency Provider Last Rate Last Dose  . 0.9 %  sodium chloride infusion   Intravenous Continuous Floydene Flock, MD      . heparin injection 5,000 Units  5,000 Units Subcutaneous 3 times per day Floydene Flock, MD      . HYDROmorphone (DILAUDID) injection 0.5 mg  0.5 mg Intravenous Q3H PRN Floydene Flock, MD       Current Outpatient Prescriptions  Medication Sig Dispense Refill  . amphetamine-dextroamphetamine (ADDERALL) 30 MG tablet Take 30 mg by mouth 2 (two) times daily.    Marland Kitchen buPROPion (WELLBUTRIN XL) 150 MG 24 hr tablet Take 150 mg by mouth every evening.    . citalopram (CELEXA) 40 MG tablet Take 40 mg by mouth every evening.    . clonazePAM (KLONOPIN) 0.5 MG tablet Take 0.5 mg by mouth 3 (three) times daily as needed for anxiety.    . gabapentin (NEURONTIN) 100 MG capsule Take 100-200 mg by mouth at bedtime.    . meloxicam (MOBIC) 15 MG tablet Take 15 mg by mouth  at bedtime.    Marland Kitchen omeprazole (PRILOSEC) 20 MG capsule Take 20 mg by mouth every morning.     Review Of Systems: Complete ROS negative except as noted above in HPI.  Physical Exam: Filed Vitals:   10/12/14 0106  BP: 133/89  Pulse: 77  Temp: 97.8 F (36.6 C)  Resp: 18    General: alert, cooperative and morbidly obese HEENT: PERRLA and extra ocular movement intact Heart: S1, S2 normal, no murmur, rub or gallop, regular rate and rhythm Lungs: clear to auscultation, no wheezes or rales and unlabored breathing Abdomen: obese abdomen, non tender, + bowel sounds   Extremities: extremities normal, atraumatic, no cyanosis or edema Skin:no rashes Neurology: normal without focal findings Psych: mood stable   Labs and Imaging: Lab Results  Component Value Date/Time   NA 138 10/11/2014 08:33 PM   K 4.5 10/11/2014 08:33 PM   CL 101 10/11/2014 08:33 PM   CO2 29 10/11/2014 08:33 PM   BUN 20 10/11/2014 08:33 PM   CREATININE 0.74 10/11/2014 08:33 PM   GLUCOSE 118* 10/11/2014 08:33 PM   Lab Results  Component Value Date   WBC 8.9 10/11/2014   HGB 11.4* 10/11/2014   HCT 36.5 10/11/2014   MCV 86.1 10/11/2014   PLT 268 10/11/2014    Ct Abdomen Pelvis W Contrast  10/12/2014   CLINICAL DATA:  Chronic severe upper abdominal pain, extending to the back. Status post fall. Initial encounter.  EXAM: CT ABDOMEN AND PELVIS WITH CONTRAST  TECHNIQUE: Multidetector CT imaging of the abdomen and pelvis was performed using the standard protocol following bolus administration of intravenous contrast.  CONTRAST:  OMNIPAQUE IOHEXOL 300 MG/ML  SOLN  COMPARISON:  CT of the abdomen and pelvis from 01/20/2006, abdominal ultrasound performed 03/13/2009, and MRI of the lumbar spine performed 10/09/2011  FINDINGS: Minimal bibasilar atelectasis is noted.  Mild soft tissue inflammation is noted about the entirety of the pancreas, concerning for acute pancreatitis. No significant free fluid is seen. There is no evidence of devascularization or pseudocyst formation at this time.  A 9 mm hypodensity is noted at the right hepatic lobe, only minimally changed in appearance from 2007 and likely benign. The liver and spleen are otherwise unremarkable. The patient is status post cholecystectomy, with clips noted at the gallbladder fossa. The adrenal glands are unremarkable.  The kidneys are unremarkable in appearance. There is no evidence of hydronephrosis. No renal or ureteral stones are seen. No perinephric stranding is appreciated.  The small bowel is unremarkable in appearance. The  stomach is within normal limits. No acute vascular abnormalities are seen.  The appendix is not definitely characterized; there is no evidence of appendicitis. The colon is largely decompressed and unremarkable in appearance.  The bladder is mildly distended and grossly unremarkable. A 1.8 cm cyst is noted at the remaining vaginal cuff, likely physiologic in nature. The patient is status post hysterectomy. No suspicious adnexal masses are seen. No inguinal lymphadenopathy is seen.  No acute osseous abnormalities are identified.  IMPRESSION: 1. Acute pancreatitis noted, with mild soft tissue inflammation about the entirety of the pancreas. No evidence of devascularization or pseudocyst formation at this time. 2. 9 mm hypodensity at the right hepatic lobe is only minimally changed from 2007 and is likely benign. 3. 1.8 cm cyst at the vaginal cuff is likely physiologic in nature.   Electronically Signed   By: Roanna Raider M.D.   On: 10/12/2014 00:15   Dg Abd Acute W/chest  10/11/2014  CLINICAL DATA:  Epigastric pain continuing into the chest, intermittent over the past year but today has been constant.  EXAM: DG ABDOMEN ACUTE W/ 1V CHEST  COMPARISON:  None.  FINDINGS: There is no evidence of dilated bowel loops or free intraperitoneal air. No radiopaque calculi or other significant radiographic abnormality is seen. Heart size and mediastinal contours are within normal limits. Both lungs are clear.  IMPRESSION: Negative abdominal radiographs.  No acute cardiopulmonary disease.   Electronically Signed   By: Ellery Plunk M.D.   On: 10/11/2014 21:39        Greater than 50% of greater than 70 minutes spent with patient in terms of direct patient care and/or care coordination.   Imaging studies and EKG readings personally reviewed    Doree Albee MD  Pager: [phone]

## 2014-10-12 NOTE — Progress Notes (Signed)
[facility] PROGRESS NOTE  Hannah Lin ZOX:[id number] DOB: March 02, 1966    PCP:   No primary care provider on file.   HPI: Hannah Lin is an 49 y.o. female with hx of PMR, s/p CCY, admitted yesterday with acute idiopathic pancreatitis, lipase >300, LFT only modestly elevated, and no evidence of infection. She had no known prior hx of pancreatitis.  She doesn't drink and doesn't take any diuretics.  Symptoms of intermittent constipation and diarrhea interpersed, with abdominal cramps is suspicious for IBS also.  She hadn't had colonoscopy in the past.   Rewiew of Systems:  Constitutional: Negative for malaise, fever and chills. No significant weight loss or weight gain Eyes: Negative for eye pain, redness and discharge, diplopia, visual changes, or flashes of light. ENMT: Negative for ear pain, hoarseness, nasal congestion, sinus pressure and sore throat. No headaches; tinnitus, drooling, or problem swallowing. Cardiovascular: Negative for chest pain, palpitations, diaphoresis, dyspnea and peripheral edema. ; No orthopnea, PND Respiratory: Negative for cough, hemoptysis, wheezing and stridor. No pleuritic chestpain. Gastrointestinal: Negative for  melena, blood in stool, hematemesis, jaundice and rectal bleeding.    Genitourinary: Negative for frequency, dysuria, incontinence,flank pain and hematuria; Musculoskeletal: Negative for back pain and neck pain. Negative for swelling and trauma.;  Skin: . Negative for pruritus, rash, abrasions, bruising and skin lesion.; ulcerations Neuro: Negative for headache, lightheadedness and neck stiffness. Negative for weakness, altered level of consciousness , altered mental status, extremity weakness, burning feet, involuntary movement, seizure and syncope.  Psych: negative for anxiety, depression, insomnia, tearfulness, panic attacks, hallucinations, paranoia, suicidal or homicidal ideation   Past Medical History  Diagnosis Date  . Polymyalgia  rheumatica   . Depressed     Past Surgical History  Procedure Laterality Date  . Cholecystectomy      Medications:  HOME MEDS: Prior to Admission medications   Medication Sig Start Date End Date Taking? Authorizing Provider  amphetamine-dextroamphetamine (ADDERALL) 30 MG tablet Take 30 mg by mouth 2 (two) times daily.   Yes Historical Provider, MD  buPROPion (WELLBUTRIN XL) 150 MG 24 hr tablet Take 150 mg by mouth every evening.   Yes Historical Provider, MD  citalopram (CELEXA) 40 MG tablet Take 40 mg by mouth every evening.   Yes Historical Provider, MD  clonazePAM (KLONOPIN) 0.5 MG tablet Take 0.5 mg by mouth 3 (three) times daily as needed for anxiety.   Yes Historical Provider, MD  gabapentin (NEURONTIN) 100 MG capsule Take 100-200 mg by mouth at bedtime.   Yes Historical Provider, MD  meloxicam (MOBIC) 15 MG tablet Take 15 mg by mouth at bedtime.   Yes Historical Provider, MD  omeprazole (PRILOSEC) 20 MG capsule Take 20 mg by mouth every morning.   Yes Historical Provider, MD     Allergies:  Allergies  Allergen Reactions  . Sulfa Antibiotics Hives    Social History:   reports that she has never smoked. She does not have any smokeless tobacco history on file. She reports that she does not drink alcohol. Her drug history is not on file.  Family History: History reviewed. No pertinent family history.   Physical Exam: Filed Vitals:   10/11/14 2200 10/11/14 2220 10/12/14 0106 10/12/14 0652  BP: 142/77 142/77 133/89 131/69  Pulse: 83 84 77 77  Temp:   97.8 F (36.6 C) 97.6 F (36.4 C)  TempSrc:   Oral Oral  Resp: Height:      Weight:    135.172 kg (  298 lb)  SpO2: 98% 98% 98% 97%   Blood pressure 131/69, pulse 77, temperature 97.6 F (36.4 C), temperature source Oral, resp. rate 20, height  (1.676 m), weight 135.172 kg (298 lb), SpO2 97 %.  GEN:  Pleasant  patient lying in the stretcher in no acute distress; cooperative with exam. PSYCH:  alert  and oriented x4; does not appear anxious or depressed; affect is appropriate. HEENT: Mucous membranes pink and anicteric; PERRLA; EOM intact; no cervical lymphadenopathy nor thyromegaly or carotid bruit; no JVD; There were no stridor. Neck is very supple. Breasts:: Not examined CHEST WALL: No tenderness CHEST: Normal respiration, clear to auscultation bilaterally.  HEART: Regular rate and rhythm.  There are no murmur, rub, or gallops.   BACK: No kyphosis or scoliosis; no CVA tenderness ABDOMEN: soft and non-tender; no masses, no organomegaly, normal abdominal bowel sounds; no pannus; no intertriginous candida. There is no rebound and no distention. Rectal Exam: Not done EXTREMITIES: No bone or joint deformity; age-appropriate arthropathy of the hands and knees; no edema; no ulcerations.  There is no calf tenderness. Genitalia: not examined PULSES: 2+ and symmetric SKIN: Normal hydration no rash or ulceration CNS: Cranial nerves 2-12 grossly intact no focal lateralizing neurologic deficit.  Speech is fluent; uvula elevated with phonation, facial symmetry and tongue midline. DTR are normal bilaterally, cerebella exam is intact, barbinski is negative and strengths are equaled bilaterally.  No sensory loss.   Labs on Admission:  Basic Metabolic Panel:  Recent Labs Lab 10/11/14 2033 10/12/14 0623  NA 138 138  K 4.5 3.5  CL 101 104  CO2 29 27  GLUCOSE 118* 101*  BUN 20 12  CREATININE 0.74 0.59  CALCIUM 8.9 8.2*   Liver Function Tests:  Recent Labs Lab 10/11/14 2033 10/12/14 0623  AST 179* 244*  ALT 100* 169*  ALKPHOS 103 125  BILITOT 1.0 1.3*  PROT 7.7 7.2  ALBUMIN 3.5 3.2*    Recent Labs Lab 10/11/14 2033  LIPASE >3000*   No results for input(s): AMMONIA in the last 168 hours. CBC:  Recent Labs Lab 10/11/14 2033 10/12/14 0623  WBC 8.9 8.2  NEUTROABS 5.6 5.2  HGB 11.4* 11.1*  HCT 36.5 36.1  MCV 86.1 86.0  PLT 268 267   Cardiac Enzymes:  Recent Labs Lab  10/11/14 2033  TROPONINI <0.03    CBG: No results for input(s): GLUCAP in the last 168 hours.   Radiological Exams on Admission: Ct Abdomen Pelvis W Contrast  10/12/2014   CLINICAL DATA:  Chronic severe upper abdominal pain, extending to the back. Status post fall. Initial encounter.  EXAM: CT ABDOMEN AND PELVIS WITH CONTRAST  TECHNIQUE: Multidetector CT imaging of the abdomen and pelvis was performed using the standard protocol following bolus administration of intravenous contrast.  CONTRAST:  OMNIPAQUE IOHEXOL 300 MG/ML  SOLN  COMPARISON:  CT of the abdomen and pelvis from 01/20/2006, abdominal ultrasound performed 03/13/2009, and MRI of the lumbar spine performed 10/09/2011  FINDINGS: Minimal bibasilar atelectasis is noted.  Mild soft tissue inflammation is noted about the entirety of the pancreas, concerning for acute pancreatitis. No significant free fluid is seen. There is no evidence of devascularization or pseudocyst formation at this time.  A 9 mm hypodensity is noted at the right hepatic lobe, only minimally changed in appearance from 2007 and likely benign. The liver and spleen are otherwise unremarkable. The patient is status post cholecystectomy, with clips noted at the gallbladder fossa. The adrenal glands are unremarkable.  The kidneys are unremarkable in appearance. There is no evidence of hydronephrosis. No renal or ureteral stones are seen. No perinephric stranding is appreciated.  The small bowel is unremarkable in appearance. The stomach is within normal limits. No acute vascular abnormalities are seen.  The appendix is not definitely characterized; there is no evidence of appendicitis. The colon is largely decompressed and unremarkable in appearance.  The bladder is mildly distended and grossly unremarkable. A 1.8 cm cyst is noted at the remaining vaginal cuff, likely physiologic in nature. The patient is status post hysterectomy. No suspicious adnexal masses are seen. No  inguinal lymphadenopathy is seen.  No acute osseous abnormalities are identified.  IMPRESSION: 1. Acute pancreatitis noted, with mild soft tissue inflammation about the entirety of the pancreas. No evidence of devascularization or pseudocyst formation at this time. 2. 9 mm hypodensity at the right hepatic lobe is only minimally changed from 2007 and is likely benign. 3. 1.8 cm cyst at the vaginal cuff is likely physiologic in nature.   Electronically Signed   By: Roanna Raider M.D.   On: 10/12/2014 00:15   Dg Abd Acute W/chest  10/11/2014   CLINICAL DATA:  Epigastric pain continuing into the chest, intermittent over the past year but today has been constant.  EXAM: DG ABDOMEN ACUTE W/ 1V CHEST  COMPARISON:  None.  FINDINGS: There is no evidence of dilated bowel loops or free intraperitoneal air. No radiopaque calculi or other significant radiographic abnormality is seen. Heart size and mediastinal contours are within normal limits. Both lungs are clear.  IMPRESSION: Negative abdominal radiographs.  No acute cardiopulmonary disease.   Electronically Signed   By: Ellery Plunk M.D.   On: 10/11/2014 21:39   Assessment/Plan Present on Admission:  . Pancreatitis   PLAN: Will continue with IVF, start clear liquid today.  Continue with pain meds.    Other plans as per orders.  Code Status: FULL Unk Lightning, MD. [facility] Pager [phone] 7pm to 7am.  10/12/2014, 11:59 AM

## 2014-10-13 DIAGNOSIS — K858 Other acute pancreatitis: Secondary | ICD-10-CM

## 2014-10-13 LAB — COMPREHENSIVE METABOLIC PANEL
ALT: 130 U/L — AB (ref 14–54)
AST: 91 U/L — AB (ref 15–41)
Albumin: 3.1 g/dL — ABNORMAL LOW (ref 3.5–5.0)
Alkaline Phosphatase: 129 U/L — ABNORMAL HIGH (ref 38–126)
Anion gap: 7 (ref 5–15)
BILIRUBIN TOTAL: 0.5 mg/dL (ref 0.3–1.2)
BUN: 6 mg/dL (ref 6–20)
CO2: 28 mmol/L (ref 22–32)
CREATININE: 0.65 mg/dL (ref 0.44–1.00)
Calcium: 8.4 mg/dL — ABNORMAL LOW (ref 8.9–10.3)
Chloride: 104 mmol/L (ref 101–111)
GFR calc Af Amer: >60 mL/min (ref >60)
GFR calc non Af Amer: >60 mL/min (ref >60)
GLUCOSE: 120 mg/dL — AB (ref 65–99)
Potassium: 3.8 mmol/L (ref 3.5–5.1)
Sodium: 139 mmol/L (ref 135–145)
TOTAL PROTEIN: 7 g/dL (ref 6.5–8.1)

## 2014-10-13 LAB — CBC WITH DIFFERENTIAL/PLATELET
BASOS PCT: 0 % (ref 0–1)
Basophils Absolute: 0 10*3/uL (ref 0.0–0.1)
EOS ABS: 0.1 10*3/uL (ref 0.0–0.7)
Eosinophils Relative: 1 % (ref 0–5)
HEMATOCRIT: 35.2 % — AB (ref 36.0–46.0)
Hemoglobin: 10.7 g/dL — ABNORMAL LOW (ref 12.0–15.0)
LYMPHS ABS: 2.3 10*3/uL (ref 0.7–4.0)
Lymphocytes Relative: 28 % (ref 12–46)
MCH: 26.4 pg (ref 26.0–34.0)
MCHC: 30.4 g/dL (ref 30.0–36.0)
MCV: 86.7 fL (ref 78.0–100.0)
MONO ABS: 0.5 10*3/uL (ref 0.1–1.0)
Monocytes Relative: 6 % (ref 3–12)
NEUTROS ABS: 5.3 10*3/uL (ref 1.7–7.7)
Neutrophils Relative %: 65 % (ref 43–77)
PLATELETS: 215 10*3/uL (ref 150–400)
RBC: 4.06 MIL/uL (ref 3.87–5.11)
RDW: 14.3 % (ref 11.5–15.5)
WBC: 8.1 10*3/uL (ref 4.0–10.5)

## 2014-10-13 LAB — LIPASE, BLOOD: Lipase: 188 U/L — ABNORMAL HIGH (ref 22–51)

## 2014-10-13 NOTE — Discharge Summary (Signed)
Physician Discharge Summary  AVANNAH DECKER EAV:[id number] DOB: 05-30-1965 DOA: 10/11/2014  PCP: None.   Admit date: 10/11/2014 Discharge date: 10/13/2014  Time spent: 45 minutes  Recommendations for Outpatient Follow-up:  1. Follow up with a local Internal medicine or PCP physician next week.    Discharge Diagnoses:  Active Problems:   Pancreatitis   Pain   Discharge Condition: Able to eat.  No abdominal pain, nausea, or vomiting.   Diet recommendation: Low fat diet.   Filed Weights   10/11/14 1720 10/11/14 1722 10/12/14 0652  Weight: 124.739 kg (275 lb) 124.739 kg (275 lb) 135.172 kg (298 lb)    History of present illness: Patient was admitted for acute pancreatitis by Dr. Alvester Morin on October 12, 2014.  As per his H and P:  " This is a 49 y.o. year old female with significant past medical history of morbid obesity, polymyalgia rheumatica presenting with pancreatitis. Pt states that she has had recurring intermittent abd pain for the past 2 years. States that she eats a high fat diet. Has recurrent episode of central/epigastric adb pain that radiates to back. Pain is usually moderate. However became acutely severe earlier today. Pt reports prior hx/p cholecystectomy in the past. No fevers or chills. No nausea or vomiting.  Presented to the ER Afebrile, hemodynamically stable. CBC WNL. CMET noted for elevated LFTs with AST 179, ALT 100. Lipase >3000. CT abd and pelvis show acute pancreatitis w/ mild general soft tissue inflammation of pancreas.   Hospital Course: Patient was admitted for acute pancreatitis not felt to be caused by alcohol or medication.  She had no pattern of biliary obstruction, and is s/p CCY.  It was likely to be idiopathic.  She was given bowel rest, IVF, and IV pain meds.  She improved quickly and her Lipase went from over 3000 to less than 200.  She was given clear liquid, and did well without abdominal pain, nausea or vomiting, and will advance her diet today.  She already  had a normal bowel movement.  She will be discharge home if she can tolerate her diet.  IVF was discontinued.  She will follow up with her PCP in one week.  She will avoid fatty food.  Thank you for allowing me to participate in her care.    Consultations:  NONE>   Discharge Exam: Filed Vitals:   10/13/14 0630  BP: 130/67  Pulse: 74  Temp: 98.3 F (36.8 C)  Resp: 20    General: appears well.  Cardiovascular: S1S2 Respiratory: Clear.   Discharge Instructions   Discharge Instructions    Diet - low sodium heart healthy    Complete by:  As directed      Discharge instructions    Complete by:  As directed   Avoid fatty food.  Follow up with your doctor in about a week.  Take your medication as directed.          Current Discharge Medication List    CONTINUE these medications which have NOT CHANGED   Details  amphetamine-dextroamphetamine (ADDERALL) 30 MG tablet Take 30 mg by mouth 2 (two) times daily.    buPROPion (WELLBUTRIN XL) 150 MG 24 hr tablet Take 150 mg by mouth every evening.    citalopram (CELEXA) 40 MG tablet Take 40 mg by mouth every evening.    clonazePAM (KLONOPIN) 0.5 MG tablet Take 0.5 mg by mouth 3 (three) times daily as needed for anxiety.    gabapentin (NEURONTIN) 100 MG capsule Take  100-200 mg by mouth at bedtime.    omeprazole (PRILOSEC) 20 MG capsule Take 20 mg by mouth every morning.      STOP taking these medications     meloxicam (MOBIC) 15 MG tablet        Allergies  Allergen Reactions  . Sulfa Antibiotics Hives      The results of significant diagnostics from this hospitalization (including imaging, microbiology, ancillary and laboratory) are listed below for reference.    Significant Diagnostic Studies: Ct Abdomen Pelvis W Contrast  10/12/2014   CLINICAL DATA:  Chronic severe upper abdominal pain, extending to the back. Status post fall. Initial encounter.  EXAM: CT ABDOMEN AND PELVIS WITH CONTRAST  TECHNIQUE: Multidetector CT  imaging of the abdomen and pelvis was performed using the standard protocol following bolus administration of intravenous contrast.  CONTRAST:  OMNIPAQUE IOHEXOL 300 MG/ML  SOLN  COMPARISON:  CT of the abdomen and pelvis from 01/20/2006, abdominal ultrasound performed 03/13/2009, and MRI of the lumbar spine performed 10/09/2011  FINDINGS: Minimal bibasilar atelectasis is noted.  Mild soft tissue inflammation is noted about the entirety of the pancreas, concerning for acute pancreatitis. No significant free fluid is seen. There is no evidence of devascularization or pseudocyst formation at this time.  A 9 mm hypodensity is noted at the right hepatic lobe, only minimally changed in appearance from 2007 and likely benign. The liver and spleen are otherwise unremarkable. The patient is status post cholecystectomy, with clips noted at the gallbladder fossa. The adrenal glands are unremarkable.  The kidneys are unremarkable in appearance. There is no evidence of hydronephrosis. No renal or ureteral stones are seen. No perinephric stranding is appreciated.  The small bowel is unremarkable in appearance. The stomach is within normal limits. No acute vascular abnormalities are seen.  The appendix is not definitely characterized; there is no evidence of appendicitis. The colon is largely decompressed and unremarkable in appearance.  The bladder is mildly distended and grossly unremarkable. A 1.8 cm cyst is noted at the remaining vaginal cuff, likely physiologic in nature. The patient is status post hysterectomy. No suspicious adnexal masses are seen. No inguinal lymphadenopathy is seen.  No acute osseous abnormalities are identified.  IMPRESSION: 1. Acute pancreatitis noted, with mild soft tissue inflammation about the entirety of the pancreas. No evidence of devascularization or pseudocyst formation at this time. 2. 9 mm hypodensity at the right hepatic lobe is only minimally changed from 2007 and is likely benign. 3.  1.8 cm cyst at the vaginal cuff is likely physiologic in nature.   Electronically Signed   By: Roanna Raider M.D.   On: 10/12/2014 00:15   Dg Abd Acute W/chest  10/11/2014   CLINICAL DATA:  Epigastric pain continuing into the chest, intermittent over the past year but today has been constant.  EXAM: DG ABDOMEN ACUTE W/ 1V CHEST  COMPARISON:  None.  FINDINGS: There is no evidence of dilated bowel loops or free intraperitoneal air. No radiopaque calculi or other significant radiographic abnormality is seen. Heart size and mediastinal contours are within normal limits. Both lungs are clear.  IMPRESSION: Negative abdominal radiographs.  No acute cardiopulmonary disease.   Electronically Signed   By: Ellery Plunk M.D.   On: 10/11/2014 21:39    Microbiology: No results found for this or any previous visit (from the past 240 hour(s)).   Labs: Basic Metabolic Panel:  Recent Labs Lab 10/11/14 2033 10/12/14 0623 10/13/14 0610  NA 138 138 139  K  4.5 3.5 3.8  CL 101 104 104  CO2 GLUCOSE 118* 101* 120*  BUN CREATININE 0.74 0.59 0.65  CALCIUM 8.9 8.2* 8.4*   Liver Function Tests:  Recent Labs Lab 10/11/14 2033 10/12/14 0623 10/13/14 0610  AST 179* 244* 91*  ALT 100* 169* 130*  ALKPHOS 103 125 129*  BILITOT 1.0 1.3* 0.5  PROT 7.7 7.2 7.0  ALBUMIN 3.5 3.2* 3.1*    Recent Labs Lab 10/11/14 2033 10/12/14 0623 10/13/14 0610  LIPASE >3000* 1585* 188*   CBC:  Recent Labs Lab 10/11/14 2033 10/12/14 0623 10/13/14 0610  WBC 8.9 8.2 8.1  NEUTROABS 5.6 5.2 5.3  HGB 11.4* 11.1* 10.7*  HCT 36.5 36.1 35.2*  MCV 86.1 86.0 86.7  PLT 268 267 215   Cardiac Enzymes:  Recent Labs Lab 10/11/14 2033  TROPONINI <0.03    Signed:  Jhace Fennell  [facility] 10/13/2014, 11:29 AM

## 2014-10-13 NOTE — Progress Notes (Signed)
Pt's IV catheter removed and intact. Pt's IV site clean dry and intact. Discharge instructions and medications reviewed and discussed with patient. All questions answered and no further questions at this time. Pt escorted by nurse tech.

## 2014-10-16 ENCOUNTER — Inpatient Hospital Stay (HOSPITAL_COMMUNITY)
Admission: EM | Admit: 2014-10-16 | Discharge: 2014-10-20 | DRG: 439 | Disposition: A | Discharge status: Home / Self Care | Payer: BLUE CROSS/BLUE SHIELD | Attending: Family Medicine | Admitting: Family Medicine

## 2014-10-16 ENCOUNTER — Encounter (HOSPITAL_COMMUNITY): Payer: Self-pay | Admitting: Emergency Medicine

## 2014-10-16 DIAGNOSIS — K859 Acute pancreatitis without necrosis or infection, unspecified: Secondary | ICD-10-CM | POA: Diagnosis present

## 2014-10-16 DIAGNOSIS — K851 Biliary acute pancreatitis: Secondary | ICD-10-CM | POA: Diagnosis not present

## 2014-10-16 DIAGNOSIS — K8020 Calculus of gallbladder without cholecystitis without obstruction: Secondary | ICD-10-CM | POA: Diagnosis present

## 2014-10-16 DIAGNOSIS — F419 Anxiety disorder, unspecified: Secondary | ICD-10-CM | POA: Diagnosis present

## 2014-10-16 DIAGNOSIS — R7989 Other specified abnormal findings of blood chemistry: Secondary | ICD-10-CM | POA: Diagnosis present

## 2014-10-16 DIAGNOSIS — Z882 Allergy status to sulfonamides status: Secondary | ICD-10-CM

## 2014-10-16 DIAGNOSIS — M353 Polymyalgia rheumatica: Secondary | ICD-10-CM | POA: Diagnosis present

## 2014-10-16 DIAGNOSIS — Z79899 Other long term (current) drug therapy: Secondary | ICD-10-CM

## 2014-10-16 DIAGNOSIS — R1110 Vomiting, unspecified: Secondary | ICD-10-CM

## 2014-10-16 DIAGNOSIS — F329 Major depressive disorder, single episode, unspecified: Secondary | ICD-10-CM | POA: Diagnosis present

## 2014-10-16 DIAGNOSIS — K861 Other chronic pancreatitis: Secondary | ICD-10-CM | POA: Diagnosis present

## 2014-10-16 DIAGNOSIS — R748 Abnormal levels of other serum enzymes: Secondary | ICD-10-CM | POA: Diagnosis present

## 2014-10-16 DIAGNOSIS — Z6841 Body Mass Index (BMI) 40.0 and over, adult: Secondary | ICD-10-CM

## 2014-10-16 DIAGNOSIS — R945 Abnormal results of liver function studies: Secondary | ICD-10-CM

## 2014-10-16 DIAGNOSIS — H6690 Otitis media, unspecified, unspecified ear: Secondary | ICD-10-CM | POA: Diagnosis present

## 2014-10-16 LAB — COMPREHENSIVE METABOLIC PANEL
ALK PHOS: 253 U/L — AB (ref 38–126)
ALT: 213 U/L — ABNORMAL HIGH (ref 14–54)
AST: 246 U/L — ABNORMAL HIGH (ref 15–41)
Albumin: 3.5 g/dL (ref 3.5–5.0)
Anion gap: 10 (ref 5–15)
BUN: 6 mg/dL (ref 6–20)
CO2: 26 mmol/L (ref 22–32)
Calcium: 9.4 mg/dL (ref 8.9–10.3)
Chloride: 101 mmol/L (ref 101–111)
Creatinine, Ser: 0.85 mg/dL (ref 0.44–1.00)
GFR calc Af Amer: >60 mL/min (ref >60)
GFR calc non Af Amer: >60 mL/min (ref >60)
GLUCOSE: 121 mg/dL — AB (ref 65–99)
POTASSIUM: 4.2 mmol/L (ref 3.5–5.1)
Sodium: 137 mmol/L (ref 135–145)
Total Bilirubin: 1.6 mg/dL — ABNORMAL HIGH (ref 0.3–1.2)
Total Protein: 8.3 g/dL — ABNORMAL HIGH (ref 6.5–8.1)

## 2014-10-16 LAB — CBC WITH DIFFERENTIAL/PLATELET
BASOS ABS: 0 10*3/uL (ref 0.0–0.1)
BASOS PCT: 0 % (ref 0–1)
Eosinophils Absolute: 0.1 10*3/uL (ref 0.0–0.7)
Eosinophils Relative: 1 % (ref 0–5)
HEMATOCRIT: 38.4 % (ref 36.0–46.0)
HEMOGLOBIN: 12.2 g/dL (ref 12.0–15.0)
Lymphocytes Relative: 28 % (ref 12–46)
Lymphs Abs: 2.1 10*3/uL (ref 0.7–4.0)
MCH: 26.6 pg (ref 26.0–34.0)
MCHC: 31.8 g/dL (ref 30.0–36.0)
MCV: 83.7 fL (ref 78.0–100.0)
MONO ABS: 0.5 10*3/uL (ref 0.1–1.0)
Monocytes Relative: 7 % (ref 3–12)
NEUTROS PCT: 64 % (ref 43–77)
Neutro Abs: 4.7 10*3/uL (ref 1.7–7.7)
Platelets: 329 10*3/uL (ref 150–400)
RBC: 4.59 MIL/uL (ref 3.87–5.11)
RDW: 14.5 % (ref 11.5–15.5)
WBC: 7.4 10*3/uL (ref 4.0–10.5)

## 2014-10-16 LAB — GLUCOSE, CAPILLARY: Glucose-Capillary: 117 mg/dL — ABNORMAL HIGH (ref 65–99)

## 2014-10-16 LAB — LIPASE, BLOOD: LIPASE: 1056 U/L — AB (ref 22–51)

## 2014-10-16 LAB — AMYLASE: AMYLASE: 280 U/L — AB (ref 28–100)

## 2014-10-16 MED ORDER — BUPROPION HCL ER (XL) 150 MG PO TB24
150.0000 mg | ORAL_TABLET | Freq: Every evening | ORAL | Status: DC
  Administered 2014-10-17 – 2014-10-19 (×3): 150 mg via ORAL
  Filled 2014-10-16 (×4): qty 1

## 2014-10-16 MED ORDER — ONDANSETRON HCL 4 MG/2ML IJ SOLN
4.0000 mg | Freq: Four times a day (QID) | INTRAMUSCULAR | Status: DC | PRN
  Administered 2014-10-17: 4 mg via INTRAVENOUS
  Filled 2014-10-16: qty 2

## 2014-10-16 MED ORDER — ACETAMINOPHEN 650 MG RE SUPP: 650.0000 mg | Freq: Four times a day (QID) | RECTAL | Status: DC | PRN

## 2014-10-16 MED ORDER — HYDROMORPHONE HCL 1 MG/ML IJ SOLN: 0.5000 mg | Freq: Once | INTRAMUSCULAR | Status: DC

## 2014-10-16 MED ORDER — MORPHINE SULFATE 4 MG/ML IJ SOLN
4.0000 mg | Freq: Once | INTRAMUSCULAR | Status: AC
  Administered 2014-10-16: 4 mg via INTRAVENOUS
  Filled 2014-10-16: qty 1

## 2014-10-16 MED ORDER — CLONAZEPAM 0.5 MG PO TABS
0.5000 mg | ORAL_TABLET | Freq: Three times a day (TID) | ORAL | Status: DC | PRN
  Filled 2014-10-16: qty 1

## 2014-10-16 MED ORDER — SODIUM CHLORIDE 0.9 % IV BOLUS (SEPSIS)
1000.0000 mL | Freq: Once | INTRAVENOUS | Status: AC
  Administered 2014-10-16: 1000 mL via INTRAVENOUS

## 2014-10-16 MED ORDER — SODIUM CHLORIDE 0.9 % IV SOLN
INTRAVENOUS | Status: AC
Start: 2014-10-16 — End: 2014-10-17
  Administered 2014-10-16: via INTRAVENOUS

## 2014-10-16 MED ORDER — GABAPENTIN 100 MG PO CAPS
100.0000 mg | ORAL_CAPSULE | Freq: Every day | ORAL | Status: DC
  Administered 2014-10-17 – 2014-10-19 (×4): 100 mg via ORAL
  Filled 2014-10-16 (×6): qty 2

## 2014-10-16 MED ORDER — ONDANSETRON HCL 4 MG/2ML IJ SOLN
4.0000 mg | Freq: Once | INTRAMUSCULAR | Status: AC
  Administered 2014-10-16: 4 mg via INTRAVENOUS

## 2014-10-16 MED ORDER — ENOXAPARIN SODIUM 40 MG/0.4ML ~~LOC~~ SOLN
40.0000 mg | Freq: Every day | SUBCUTANEOUS | Status: DC
  Administered 2014-10-17 – 2014-10-20 (×4): 40 mg via SUBCUTANEOUS
  Filled 2014-10-16 (×4): qty 0.4

## 2014-10-16 MED ORDER — HYDROMORPHONE HCL 1 MG/ML IJ SOLN
1.0000 mg | INTRAMUSCULAR | Status: DC | PRN
  Administered 2014-10-16 – 2014-10-19 (×14): 1 mg via INTRAVENOUS
  Filled 2014-10-16 (×18): qty 1

## 2014-10-16 MED ORDER — CITALOPRAM HYDROBROMIDE 40 MG PO TABS
40.0000 mg | ORAL_TABLET | Freq: Every evening | ORAL | Status: DC
  Administered 2014-10-17 – 2014-10-19 (×3): 40 mg via ORAL
  Filled 2014-10-16 (×4): qty 1

## 2014-10-16 MED ORDER — ACETAMINOPHEN 325 MG PO TABS
650.0000 mg | ORAL_TABLET | Freq: Four times a day (QID) | ORAL | Status: DC | PRN
  Administered 2014-10-18 (×2): 650 mg via ORAL
  Filled 2014-10-16 (×3): qty 2

## 2014-10-16 MED ORDER — ONDANSETRON HCL 4 MG/2ML IJ SOLN
INTRAMUSCULAR | Status: AC
  Filled 2014-10-16: qty 2

## 2014-10-16 MED ORDER — ONDANSETRON HCL 4 MG PO TABS: 4.0000 mg | ORAL_TABLET | Freq: Four times a day (QID) | ORAL | Status: DC | PRN

## 2014-10-16 NOTE — ED Notes (Signed)
Pt sts hospitalized this weekend for pancreatitis and still having pain in upper abd area that is severe in nature

## 2014-10-16 NOTE — H&P (Signed)
[facility] History and Physical  Hannah Lin NAT:[id number] DOB: 06/10/1965 DOA: 10/16/2014  Referring physician: Ms.Marissa. PCP: Pamelia Hoit, MD  Specialists: None.  Chief Complaint: Abdominal pain.  HPI: Hannah Lin is a 49 y.o. female with history of recurrent pancreatitis who was recently admitted at [facility] for acute pancreatitis and just discharged 3 days ago presents to the ER because of severe abdominal pain. Patient is in the epigastric area radiating to the back stabbing in nature with some nausea denies any vomiting or diarrhea. Denies any chest pain or shortness of breath. In the ER patient's lipase and LFTs are elevated. Patient after her last discharge had appointment with Dr. Elnoria Howard gastroenterologist today and at the office patient started developing severe pain and was referred to the ER. Sonogram of the abdomen is pending. Patient denies having any new medications and denies taking alcohol.  Review of Systems: As presented in the history of presenting illness, rest negative.  Past Medical History  Diagnosis Date  . Polymyalgia rheumatica   . Depressed    Past Surgical History  Procedure Laterality Date  . Cholecystectomy     Social History:  reports that she has never smoked. She does not have any smokeless tobacco history on file. She reports that she does not drink alcohol. Her drug history is not on file. Where does patient live home.  Can patient participate in ADLs? Yes.  Allergies  Allergen Reactions  . Sulfa Antibiotics Hives    Family History:  Family History  Problem Relation Age of Onset  . Pancreatitis Neg Hx       Prior to Admission medications   Medication Sig Start Date End Date Taking? Authorizing Provider  amphetamine-dextroamphetamine (ADDERALL) 30 MG tablet Take 30 mg by mouth 2 (two) times daily.   Yes Historical Provider, MD  buPROPion (WELLBUTRIN XL) 150 MG 24 hr tablet Take 150 mg by mouth every evening.   Yes  Historical Provider, MD  citalopram (CELEXA) 40 MG tablet Take 40 mg by mouth every evening.   Yes Historical Provider, MD  clonazePAM (KLONOPIN) 0.5 MG tablet Take 0.5 mg by mouth 3 (three) times daily as needed for anxiety.   Yes Historical Provider, MD  gabapentin (NEURONTIN) 100 MG capsule Take 100-200 mg by mouth at bedtime.   Yes Historical Provider, MD  omeprazole (PRILOSEC) 20 MG capsule Take 20 mg by mouth every morning.   Yes Historical Provider, MD    Physical Exam: Filed Vitals:   10/16/14 1718 10/16/14 2207 10/16/14 2308  BP: 159/103 135/67 150/82  Pulse: 100 96 79  Temp: 98.1 F (36.7 C)  97.6 F (36.4 C)  TempSrc: Oral  Oral  Resp: 18 16 15   Height:   5\' 6"  (1.676 m)  Weight:   132.45 kg (292 lb)  SpO2: 95% 100% 99%     General:  Obese not in distress.   Eyes: Anicteric no pallor.   ENT: No discharge from ears eyes nose and mouth.   Neck: No mass felt.   Cardiovascular: S1-S2 heard.   Respiratory: No rhonchi or crepitations.   Abdomen: Soft nontender bowel sounds present.   Skin: No rash.   Musculoskeletal: No edema.   Psychiatric: Appears normal.  Neurologic: Alert awake oriented to time place and person. Moves all extremities.   Labs on Admission:  Basic Metabolic Panel:  Recent Labs Lab 10/11/14 2033 10/12/14 0623 10/13/14 0610 10/16/14 1722  NA 138 138 139 137  K 4.5 3.5 3.8  4.2  CL 101 104 104 101  CO2 GLUCOSE 118* 101* 120* 121*  BUN CREATININE 0.74 0.59 0.65 0.85  CALCIUM 8.9 8.2* 8.4* 9.4   Liver Function Tests:  Recent Labs Lab 10/11/14 2033 10/12/14 0623 10/13/14 0610 10/16/14 1722  AST 179* 244* 91* 246*  ALT 100* 169* 130* 213*  ALKPHOS 103 125 129* 253*  BILITOT 1.0 1.3* 0.5 1.6*  PROT 7.7 7.2 7.0 8.3*  ALBUMIN 3.5 3.2* 3.1* 3.5    Recent Labs Lab 10/11/14 2033 10/12/14 0623 10/13/14 0610 10/16/14 1722  LIPASE >3000* 1585* 188* 1056*  AMYLASE  --   --   --  280*   No results for  input(s): AMMONIA in the last 168 hours. CBC:  Recent Labs Lab 10/11/14 2033 10/12/14 0623 10/13/14 0610 10/16/14 1722  WBC 8.9 8.2 8.1 7.4  NEUTROABS 5.6 5.2 5.3 4.7  HGB 11.4* 11.1* 10.7* 12.2  HCT 36.5 36.1 35.2* 38.4  MCV 86.1 86.0 86.7 83.7  PLT 268 267 215 329   Cardiac Enzymes:  Recent Labs Lab 10/11/14 2033  TROPONINI <0.03    BNP (last 3 results) No results for input(s): BNP in the last 8760 hours.  ProBNP (last 3 results) No results for input(s): PROBNP in the last 8760 hours.  CBG: No results for input(s): GLUCAP in the last 168 hours.  Radiological Exams on Admission: No results found.   Assessment/Plan Principal Problem:   Acute pancreatitis Active Problems:   Elevated LFTs   1. Acute pancreatitis with history of recurrent pancreatitis - cause not clear. Patient has already had a cholecystectomy. Patient denies drinking alcohol. Check triglycerides. Sonogram of the abdomen is pending. I have ordered an MRCP. May consult Dr. Elnoria Howard gastroenterologist. For now I have place patient nothing by mouth gentle hydration and pain relief medications. 2. Elevated LFTs - follow LFTs closely. 3. History of polymyalgia rheumatica present on no medications. 4. History of anxiety continue present medications.   DVT Prophylaxis Lovenox.  Code Status: Full code.  Family Communication: Discussed with patient.  Disposition Plan: Admit to inpatient.    Dmarco Baldus N. [facility] Pager [phone].  If 7PM-7AM, please contact night-coverage [email or web address] Password [facility] 10/16/2014, 11:34 PM

## 2014-10-16 NOTE — ED Notes (Signed)
PA at bedside.

## 2014-10-16 NOTE — ED Provider Notes (Signed)
CSN: [id number]     Arrival date & time 10/16/14  1709 History   This chart was scribed for non-physician practitioner, Raymon Mutton, PA-C, working with Purvis Sheffield, MD, by Abel Presto, ED Scribe. This patient was seen in room TR02C/TR02C and the patient's care was started at 9:3 PM.    Chief Complaint  Patient presents with  . Abdominal Pain     The history is provided by the patient. No language interpreter was used.   HPI Comments: Hannah Lin is a 49 y.o. female with h/o polymyalgia rheumatica, depression, and cholecystectomy who presents to the Emergency Department complaining of constant sharp and achy epigastric abdominal pain with onset today. She reports she was not having pain yesterday. She notes associated nausea and states pain radiates into her back. Pt was seen at [facility] on 10/11/14 and admitted for acute pancreatitis. She was d/c on 10/13/14.  Pt has not taken any medication for relief. Pt states she is unable to tolerate solid foods. She denies EtOH use. Pt with PSHx of hysterectomy 23 years ago.  She denies vomiting, hematochezia, melena, chest pain, SOB, difficulty breathing, fever, chills, hemoptysis, cough, and changes in flatulence.  PCP is Dr. Andrey Campanile.  Past Medical History  Diagnosis Date  . Polymyalgia rheumatica   . Depressed    Past Surgical History  Procedure Laterality Date  . Cholecystectomy     History reviewed. No pertinent family history. History  Substance Use Topics  . Smoking status: Never Smoker   . Smokeless tobacco: Not on file  . Alcohol Use: No   OB History    No data available     Review of Systems  Constitutional: Negative for fever and chills.  Respiratory: Positive for shortness of breath. Negative for cough.   Cardiovascular: Negative for chest pain.  Gastrointestinal: Positive for nausea and abdominal pain. Negative for vomiting and blood in stool.  Musculoskeletal: Positive for back pain.      Allergies   Sulfa antibiotics  Home Medications   Prior to Admission medications   Medication Sig Start Date End Date Taking? Authorizing Provider  amphetamine-dextroamphetamine (ADDERALL) 30 MG tablet Take 30 mg by mouth 2 (two) times daily.    Historical Provider, MD  buPROPion (WELLBUTRIN XL) 150 MG 24 hr tablet Take 150 mg by mouth every evening.    Historical Provider, MD  citalopram (CELEXA) 40 MG tablet Take 40 mg by mouth every evening.    Historical Provider, MD  clonazePAM (KLONOPIN) 0.5 MG tablet Take 0.5 mg by mouth 3 (three) times daily as needed for anxiety.    Historical Provider, MD  gabapentin (NEURONTIN) 100 MG capsule Take 100-200 mg by mouth at bedtime.    Historical Provider, MD  omeprazole (PRILOSEC) 20 MG capsule Take 20 mg by mouth every morning.    Historical Provider, MD   BP 135/67 mmHg  Pulse 96  Temp(Src) 98.1 F (36.7 C) (Oral)  Resp 16  SpO2 100% Physical Exam  Constitutional: She is oriented to person, place, and time. She appears well-developed and well-nourished. No distress.  HENT:  Head: Normocephalic and atraumatic.  Eyes: Conjunctivae and EOM are normal. Pupils are equal, round, and reactive to light. Right eye exhibits no discharge. Left eye exhibits no discharge.  Neck: Normal range of motion. Neck supple. No tracheal deviation present.  Cardiovascular: Normal rate, regular rhythm and normal heart sounds.   Pulmonary/Chest: Effort normal and breath sounds normal. No respiratory distress. She has no wheezes. She has  no rales.  Abdominal: Soft. Bowel sounds are normal. She exhibits no distension. There is tenderness in the right upper quadrant, epigastric area and left upper quadrant. There is no rebound, no guarding and no CVA tenderness.  Obese   Musculoskeletal: Normal range of motion.  Lymphadenopathy:    She has no cervical adenopathy.  Neurological: She is alert and oriented to person, place, and time. No cranial nerve deficit. She exhibits normal  muscle tone. Coordination normal.  Skin: Skin is warm and dry. No rash noted. She is not diaphoretic. No erythema.  Psychiatric: She has a normal mood and affect. Her behavior is normal. Thought content normal.  Nursing note and vitals reviewed.   ED Course  Procedures (including critical care time) DIAGNOSTIC STUDIES: Oxygen Saturation is 95% on room air, normal by my interpretation.    COORDINATION OF CARE: 9:46 PM Discussed treatment plan with patient at beside, the patient agrees with the plan and has no further questions at this time.   Results for orders placed or performed during the hospital encounter of 10/16/14  Comprehensive metabolic panel  Result Value Ref Range   Sodium 137 135 - 145 mmol/L   Potassium 4.2 3.5 - 5.1 mmol/L   Chloride 101 101 - 111 mmol/L   CO2 26 22 - 32 mmol/L   Glucose, Bld 121 (H) 65 - 99 mg/dL   BUN 6 6 - 20 mg/dL   Creatinine, Ser 5.78 0.44 - 1.00 mg/dL   Calcium 9.4 8.9 - 46.9 mg/dL   Total Protein 8.3 (H) 6.5 - 8.1 g/dL   Albumin 3.5 3.5 - 5.0 g/dL   AST 629 (H) 15 - 41 U/L   ALT 213 (H) 14 - 54 U/L   Alkaline Phosphatase 253 (H) 38 - 126 U/L   Total Bilirubin 1.6 (H) 0.3 - 1.2 mg/dL   GFR calc non Af Amer >60 >60 mL/min   GFR calc Af Amer >60 >60 mL/min   Anion gap 10 5 - 15  Lipase, blood  Result Value Ref Range   Lipase 1056 (H) 22 - 51 U/L  CBC with Differential  Result Value Ref Range   WBC 7.4 4.0 - 10.5 K/uL   RBC 4.59 3.87 - 5.11 MIL/uL   Hemoglobin 12.2 12.0 - 15.0 g/dL   HCT 52.8 41.3 - 24.4 %   MCV 83.7 78.0 - 100.0 fL   MCH 26.6 26.0 - 34.0 pg   MCHC 31.8 30.0 - 36.0 g/dL   RDW 01.0 27.2 - 53.6 %   Platelets 329 150 - 400 K/uL   Neutrophils Relative % 64 43 - 77 %   Neutro Abs 4.7 1.7 - 7.7 K/uL   Lymphocytes Relative 28 12 - 46 %   Lymphs Abs 2.1 0.7 - 4.0 K/uL   Monocytes Relative 7 3 - 12 %   Monocytes Absolute 0.5 0.1 - 1.0 K/uL   Eosinophils Relative 1 0 - 5 %   Eosinophils Absolute 0.1 0.0 - 0.7 K/uL    Basophils Relative 0 0 - 1 %   Basophils Absolute 0.0 0.0 - 0.1 K/uL  Amylase  Result Value Ref Range   Amylase 280 (H) 28 - 100 U/L    Labs Review Labs Reviewed  COMPREHENSIVE METABOLIC PANEL - Abnormal; Notable for the following:    Glucose, Bld 121 (*)    Total Protein 8.3 (*)    AST 246 (*)    ALT 213 (*)    Alkaline Phosphatase 253 (*)  Total Bilirubin 1.6 (*)    All other components within normal limits  LIPASE, BLOOD - Abnormal; Notable for the following:    Lipase 1056 (*)    All other components within normal limits  AMYLASE - Abnormal; Notable for the following:    Amylase 280 (*)    All other components within normal limits  CBC WITH DIFFERENTIAL/PLATELET    Imaging Review No results found.   EKG Interpretation None       CLINICAL DATA: Chronic severe upper abdominal pain, extending to the back. Status post fall. Initial encounter.  EXAM: CT ABDOMEN AND PELVIS WITH CONTRAST  TECHNIQUE: Multidetector CT imaging of the abdomen and pelvis was performed using the standard protocol following bolus administration of intravenous contrast.  CONTRAST: OMNIPAQUE IOHEXOL 300 MG/ML SOLN  COMPARISON: CT of the abdomen and pelvis from 01/20/2006, abdominal ultrasound performed 03/13/2009, and MRI of the lumbar spine performed 10/09/2011  FINDINGS: Minimal bibasilar atelectasis is noted.  Mild soft tissue inflammation is noted about the entirety of the pancreas, concerning for acute pancreatitis. No significant free fluid is seen. There is no evidence of devascularization or pseudocyst formation at this time.  A 9 mm hypodensity is noted at the right hepatic lobe, only minimally changed in appearance from 2007 and likely benign. The liver and spleen are otherwise unremarkable. The patient is status post cholecystectomy, with clips noted at the gallbladder fossa. The adrenal glands are unremarkable.  The kidneys are unremarkable in  appearance. There is no evidence of hydronephrosis. No renal or ureteral stones are seen. No perinephric stranding is appreciated.  The small bowel is unremarkable in appearance. The stomach is within normal limits. No acute vascular abnormalities are seen.  The appendix is not definitely characterized; there is no evidence of appendicitis. The colon is largely decompressed and unremarkable in appearance.  The bladder is mildly distended and grossly unremarkable. A 1.8 cm cyst is noted at the remaining vaginal cuff, likely physiologic in nature. The patient is status post hysterectomy. No suspicious adnexal masses are seen. No inguinal lymphadenopathy is seen.  No acute osseous abnormalities are identified.  IMPRESSION: 1. Acute pancreatitis noted, with mild soft tissue inflammation about the entirety of the pancreas. No evidence of devascularization or pseudocyst formation at this time. 2. 9 mm hypodensity at the right hepatic lobe is only minimally changed from 2007 and is likely benign. 3. 1.8 cm cyst at the vaginal cuff is likely physiologic in nature.   Electronically Signed  By: Roanna Raider M.D.  On: 10/12/2014 00:15   10:14 PM this provider spoke with Dr. Toniann Fail, [facility] hospitalist. Discussed case, labs, imaging, ED course, vitals in great detail. Patient to be admitted to MedSurg floor.  MDM   Final diagnoses:  Acute pancreatitis, unspecified pancreatitis type  Intractable vomiting with nausea, vomiting of unspecified type    Medications  sodium chloride 0.9 % bolus 1,000 mL (1,000 mLs Intravenous New Bag/Given 10/16/14 2139)  morphine 4 MG/ML injection 4 mg (4 mg Intravenous Given 10/16/14 2203)  ondansetron (ZOFRAN) injection 4 mg (4 mg Intravenous Given 10/16/14 2206)  ondansetron (ZOFRAN) 4 MG/2ML injection (  Duplicate 10/16/14 2211)    Filed Vitals:   10/16/14 1718 10/16/14 2207  BP: 159/103 135/67  Pulse: 100 96  Temp: 98.1 F (36.7 C)    TempSrc: Oral   Resp: 18 16  SpO2: 95% 100%    I personally performed the services described in this documentation, which was scribed in my presence. The recorded  information has been reviewed and is accurate.  This provider reviewed the patient's chart. Patient was last and assessed in ED setting on 10/11/2014 regarding acute pancreatitis. Patient was discharged home on 10/13/2014. Reported that the pain got grossly worse today. Stated that she's been unable to tolerate solid foods. Vomiting identified in ED setting. On 10/15/2014 patient has CT abdomen and pelvis with contrast performed that identified acute pancreatitis without pseudocyst, soft tissue inflammation identified throughout the entire pancreas. A 9 mm hypodensity noted in the right hepatic lobe is minimally changed from 2007. CBC negative elevated leukocytosis. Hemoglobin 12.2, hematocrit 30.4. CMP noted glucose 121 with negative elevated anion gap. AST of 246, ALT of 213, alkaline phosphatase of 253, bilirubin of 1.6. When compared to previous labs patient's liver enzymes have increased very similar to approximate 4 days ago, has worsened when compared to 3 days ago when patient is discharged. Lipase elevated at 1056 - has increased when compared to 3 days ago at 188. Patient given IV fluids in ED setting as well as IV pain medications. Patient presented to the ED with acute pancreatitis with involvement of liver enzymes. Abdominal ultrasound ordered and pending. Discussed case in great detail with Dr. Toniann Fail, [facility] hospitalist. Patient to be admitted under the care of [facility]. Discussed plan for admission with patient in great detail who is in accordance to plan of care.  Raymon Mutton, PA-C 10/16/14 2222  Purvis Sheffield, MD 10/17/14 [phone]

## 2014-10-16 NOTE — ED Notes (Signed)
Attempted IV x1, unsuccessful. Second RN to attempt 

## 2014-10-17 ENCOUNTER — Inpatient Hospital Stay (HOSPITAL_COMMUNITY): Payer: BLUE CROSS/BLUE SHIELD

## 2014-10-17 DIAGNOSIS — R7989 Other specified abnormal findings of blood chemistry: Secondary | ICD-10-CM

## 2014-10-17 DIAGNOSIS — K851 Biliary acute pancreatitis: Secondary | ICD-10-CM

## 2014-10-17 LAB — TROPONIN I: Troponin I: <0.03 ng/mL (ref <0.031)

## 2014-10-17 LAB — HEPATIC FUNCTION PANEL
ALK PHOS: 245 U/L — AB (ref 38–126)
ALT: 235 U/L — ABNORMAL HIGH (ref 14–54)
AST: 257 U/L — ABNORMAL HIGH (ref 15–41)
Albumin: 3.2 g/dL — ABNORMAL LOW (ref 3.5–5.0)
BILIRUBIN INDIRECT: 1.1 mg/dL — AB (ref 0.3–0.9)
Bilirubin, Direct: 1.3 mg/dL — ABNORMAL HIGH (ref 0.1–0.5)
TOTAL PROTEIN: 7.6 g/dL (ref 6.5–8.1)
Total Bilirubin: 2.4 mg/dL — ABNORMAL HIGH (ref 0.3–1.2)

## 2014-10-17 LAB — BASIC METABOLIC PANEL
Anion gap: 9 (ref 5–15)
BUN: 6 mg/dL (ref 6–20)
CO2: 27 mmol/L (ref 22–32)
Calcium: 8.9 mg/dL (ref 8.9–10.3)
Chloride: 99 mmol/L — ABNORMAL LOW (ref 101–111)
Creatinine, Ser: 0.82 mg/dL (ref 0.44–1.00)
GFR calc non Af Amer: >60 mL/min (ref >60)
Glucose, Bld: 126 mg/dL — ABNORMAL HIGH (ref 65–99)
POTASSIUM: 4.5 mmol/L (ref 3.5–5.1)
Sodium: 135 mmol/L (ref 135–145)

## 2014-10-17 LAB — URINALYSIS, ROUTINE W REFLEX MICROSCOPIC
Glucose, UA: NEGATIVE mg/dL
HGB URINE DIPSTICK: NEGATIVE
KETONES UR: NEGATIVE mg/dL
Leukocytes, UA: NEGATIVE
Nitrite: NEGATIVE
PROTEIN: NEGATIVE mg/dL
Specific Gravity, Urine: 1.014 (ref 1.005–1.030)
UROBILINOGEN UA: 4 mg/dL — AB (ref 0.0–1.0)
pH: 6 (ref 5.0–8.0)

## 2014-10-17 LAB — CBC WITH DIFFERENTIAL/PLATELET
Basophils Absolute: 0 10*3/uL (ref 0.0–0.1)
Basophils Relative: 0 % (ref 0–1)
EOS ABS: 0.1 10*3/uL (ref 0.0–0.7)
Eosinophils Relative: 1 % (ref 0–5)
HEMATOCRIT: 36 % (ref 36.0–46.0)
Hemoglobin: 11.4 g/dL — ABNORMAL LOW (ref 12.0–15.0)
LYMPHS ABS: 2.1 10*3/uL (ref 0.7–4.0)
Lymphocytes Relative: 23 % (ref 12–46)
MCH: 26.6 pg (ref 26.0–34.0)
MCHC: 31.7 g/dL (ref 30.0–36.0)
MCV: 83.9 fL (ref 78.0–100.0)
MONOS PCT: 8 % (ref 3–12)
Monocytes Absolute: 0.7 10*3/uL (ref 0.1–1.0)
NEUTROS PCT: 68 % (ref 43–77)
Neutro Abs: 6.4 10*3/uL (ref 1.7–7.7)
Platelets: 311 10*3/uL (ref 150–400)
RBC: 4.29 MIL/uL (ref 3.87–5.11)
RDW: 14.5 % (ref 11.5–15.5)
WBC: 9.3 10*3/uL (ref 4.0–10.5)

## 2014-10-17 LAB — RAPID URINE DRUG SCREEN, HOSP PERFORMED
Amphetamines: POSITIVE — AB
BARBITURATES: NOT DETECTED
Benzodiazepines: NOT DETECTED
Cocaine: NOT DETECTED
Opiates: POSITIVE — AB
Tetrahydrocannabinol: NOT DETECTED

## 2014-10-17 LAB — LIPID PANEL
CHOL/HDL RATIO: 3.6 ratio
Cholesterol: 232 mg/dL — ABNORMAL HIGH (ref 0–200)
HDL: 65 mg/dL (ref >40)
LDL CALC: 154 mg/dL — AB (ref 0–99)
Triglycerides: 64 mg/dL (ref <150)
VLDL: 13 mg/dL (ref 0–40)

## 2014-10-17 LAB — GLUCOSE, CAPILLARY
GLUCOSE-CAPILLARY: 113 mg/dL — AB (ref 65–99)
Glucose-Capillary: 102 mg/dL — ABNORMAL HIGH (ref 65–99)
Glucose-Capillary: 85 mg/dL (ref 65–99)
Glucose-Capillary: 85 mg/dL (ref 65–99)

## 2014-10-17 MED ORDER — GADOBENATE DIMEGLUMINE 529 MG/ML IV SOLN
20.0000 mL | Freq: Once | INTRAVENOUS | Status: AC | PRN
  Administered 2014-10-17: 20 mL via INTRAVENOUS

## 2014-10-17 NOTE — Progress Notes (Addendum)
Late entry:  New Admission Note:   Arrival: Via stretcher from ED with tech Mental Orientation: A&Ox4 Telemetry: None ordered Assessment:  See doc flowsheet Skin: Intact.  Assessed with Durenda Hurt, RN.  Patient refused to allow assessment of back.   IV: Left arm, infusing Pain: 8/10 abdominal pain Safety Measures:  Call bell placed within reach; patient instructed on use of call bell and verbalized understanding. Bed in lowest position.  Non-skid socks on.  Bed alarm on. [address] Orientation: Patient oriented to staff, room, and unit. Family: None at bedside  Orders have been reviewed and implemented. Unable to complete patient education due to lack of patient booklets on unit.  Will continue to monitor.  Rozann Lesches, RN, BSN

## 2014-10-17 NOTE — Progress Notes (Signed)
[facility] PROGRESS NOTE  Hannah Lin NWG:[id number] DOB: [birth year] DOA: 10/16/2014 PCP: Pamelia Hoit, MD  Assessment/Plan: 1. Acute pancreatitis- patient presented with acute mental otitis with elevated lipase, MRCP done this morning shows mild dilation of CBD. Discussed with Dr. Elnoria Howard who thinks that this could be from the because of the pancreatic edema causing the obstruction. We will keep the patient strict nothing by mouth, continue IV hydration normal saline at 125 per hour. We will follow CMP and lipase in a.m. 2. Anxiety- continue clonazepam 0.5 mg 3 times a day when necessary, continue Celexa 40 mg daily, Wellbutrin 150 mg by mouth daily  Code Status: Full code Family Communication: *Discussed with son at bedside Disposition Plan: Home when medically stable   Consultants:  GI  Procedures:  None  Antibiotics:  None  HPI/Subjective: 49 y.o. female with history of recurrent pancreatitis who was recently admitted at [facility] for acute pancreatitis and just discharged 3 days ago presents to the ER because of severe abdominal pain. Patient is in the epigastric area radiating to the back stabbing in nature with some nausea denies any vomiting or diarrhea. Denies any chest pain or shortness of breath. In the ER patient's lipase and LFTs are elevated. Patient after her last discharge had appointment with Dr. Elnoria Howard gastroenterologist today and at the office patient started developing severe pain and was referred to the ER. Sonogram of the abdomen is pending. Patient denies having any new medications and denies taking alcohol.  This morning says that she is hungry, wants ice chips.  Objective: Filed Vitals:   10/17/14 1000  BP: 111/68  Pulse: 70  Temp: 97.7 F (36.5 C)  Resp: 16    Intake/Output Summary (Last 24 hours) at 10/17/14 1808 Last data filed at 10/17/14 1700  Gross per 24 hour  Intake 808.33 ml  Output   1450 ml  Net -641.67 ml   Filed  Weights   10/16/14 2308  Weight: 132.45 kg (292 lb)    Exam:   General:  Appear in no acute distress  Cardiovascular: S1s2 RRR  Respiratory: Clear bilaterally  Abdomen: Soft, nontender, no organomegaly  Musculoskeletal: No edema of the lower extremities   Data Reviewed: Basic Metabolic Panel:  Recent Labs Lab 10/11/14 2033 10/12/14 0623 10/13/14 0610 10/16/14 1722 10/17/14 0102  NA 138 138 139 137 135  K 4.5 3.5 3.8 4.2 4.5  CL 101 104 104 101 99*  CO2 GLUCOSE 118* 101* 120* 121* 126*  BUN CREATININE 0.74 0.59 0.65 0.85 0.82  CALCIUM 8.9 8.2* 8.4* 9.4 8.9   Liver Function Tests:  Recent Labs Lab 10/11/14 2033 10/12/14 0623 10/13/14 0610 10/16/14 1722 10/17/14 0102  AST 179* 244* 91* 246* 257*  ALT 100* 169* 130* 213* 235*  ALKPHOS 103 125 129* 253* 245*  BILITOT 1.0 1.3* 0.5 1.6* 2.4*  PROT 7.7 7.2 7.0 8.3* 7.6  ALBUMIN 3.5 3.2* 3.1* 3.5 3.2*    Recent Labs Lab 10/11/14 2033 10/12/14 0623 10/13/14 0610 10/16/14 1722  LIPASE >3000* 1585* 188* 1056*  AMYLASE  --   --   --  280*   No results for input(s): AMMONIA in the last 168 hours. CBC:  Recent Labs Lab 10/11/14 2033 10/12/14 0623 10/13/14 0610 10/16/14 1722 10/17/14 0102  WBC 8.9 8.2 8.1 7.4 9.3  NEUTROABS 5.6 5.2 5.3 4.7 6.4  HGB 11.4* 11.1* 10.7* 12.2 11.4*  HCT 36.5 36.1 35.2*  38.4 36.0  MCV 86.1 86.0 86.7 83.7 83.9  PLT 268 267 215 329 311   Cardiac Enzymes:  Recent Labs Lab 10/11/14 2033 10/17/14 0102  TROPONINI <0.03 <0.03   BNP (last 3 results) No results for input(s): BNP in the last 8760 hours.  ProBNP (last 3 results) No results for input(s): PROBNP in the last 8760 hours.  CBG:  Recent Labs Lab 10/16/14 2342 10/17/14 0627 10/17/14 1152 10/17/14 1707  GLUCAP 117* 113* 102* 85    No results found for this or any previous visit (from the past 240 hour(s)).   Studies: US Abdomen Complete  10/17/2014   CLINICAL DATA:   Acute onset of generalized abdominal pain. Initial encounter.  EXAM: ULTRASOUND ABDOMEN COMPLETE  COMPARISON:  CT of the abdomen pelvis from 10/11/2014, and abdominal ultrasound performed 03/13/2009  FINDINGS: Gallbladder: Status post cholecystectomy.  No retained stones seen.  Common bile duct: Diameter: 1.5 cm; this is only minimally changed from the prior CT, at which time it measured 1.3 cm. This may reflect the patient's underlying acute pancreatitis.  Liver: No focal lesion identified. Coarsened echotexture likely reflects diffuse fatty infiltration.  IVC: No abnormality visualized.  Pancreas: Not visualized on this study.  Spleen: Size and appearance within normal limits.  Right Kidney: Length: 10.9 cm. Echogenicity within normal limits. No mass or hydronephrosis visualized.  Left Kidney: Length: 10.5 cm. Echogenicity within normal limits. No mass or hydronephrosis visualized.  Abdominal aorta: No aneurysm visualized.  Other findings: None.  IMPRESSION: 1. Common bile duct measures 1.5 cm; this is only minimally changed from the prior CT, at which time it measured 1.3 cm. This may reflect the patient's underlying known acute pancreatitis. The patient is status post cholecystectomy. 2. Diffuse fatty infiltration within the liver.   Electronically Signed   By: Roanna Raider M.D.   On: 10/17/2014 01:54   Mr 3d Recon At Scanner  10/17/2014   CLINICAL DATA:  Recurrent acute pancreatitis. Severe abdominal and epigastric pain. Nausea. Elevated liver function tests. Biliary dilatation seen on recent ultrasound. Prior cholecystectomy.  EXAM: MRI ABDOMEN WITHOUT AND WITH CONTRAST (INCLUDING MRCP)  TECHNIQUE: Multiplanar multisequence MR imaging of the abdomen was performed both before and after the administration of intravenous contrast. Heavily T2-weighted images of the biliary and pancreatic ducts were obtained, and three-dimensional MRCP images were rendered by post processing.  CONTRAST:  20mL MULTIHANCE  GADOBENATE DIMEGLUMINE 529 MG/ML IV SOLN  COMPARISON:  CT on 10/11/2014  FINDINGS: Lower chest: No acute findings.  Hepatobiliary: Mild diffuse hepatic steatosis demonstrated on chemical shift imaging. A tiny sub-cm cyst is seen in segment 8 of the right hepatic lobe, however no liver masses are identified.  Prior cholecystectomy noted. Mild biliary ductal dilatation is seen with common bile duct measuring 12 mm. No evidence of choledocholithiasis. Smooth tapering of the intrapancreatic portion of the distal common bile duct seen, over length of approximately 15 mm on image 43/series 8, suspicious for a distal common bile duct stricture related to pancreatitis.  Pancreas: Diffuse pancreatic swelling and mild peripancreatic inflammatory changes are seen, consistent with acute pancreatitis. No evidence of pancreatic mass or pancreatic ductal dilatation. No evidence of pancreatic necrosis or peripancreatic fluid collections.  Spleen:  Within normal limits in size and appearance.  Adrenal Glands:  No masses identified.  Kidneys: No renal masses identified. No evidence of hydronephrosis.  Stomach/Bowel/Peritoneum: No evidence of wall thickening, mass, or obstruction involving visualized abdominal bowel.  Vascular/Lymphatic: Mild lymphadenopathy noted in the porta hepatis  with largest lymph node measuring 2.0 cm on image 19/series 14. This is likely reactive in etiology given findings of acute pancreatitis. No other significant abnormality noted.  Other:  None.  Musculoskeletal:  No suspicious bone lesions identified.  IMPRESSION: Mild to moderate acute pancreatitis. No evidence of pancreatic necrosis, pseudocyst, or other pancreatic complication. No evidence of pancreatic ductal dilatation.  Prior cholecystectomy. Mild diffuse biliary ductal dilatation, with common bile duct measuring 12 mm. 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  Mild diffuse hepatic steatosis.  No evidence of hepatic neoplasm.  Mild porta hepatis lymphadenopathy, likely reactive in etiology given findings of acute pancreatitis.   Electronically Signed   By: Myles Rosenthal M.D.   On: 10/17/2014 08:59   Mr Abd W/wo Cm/mrcp  10/17/2014   CLINICAL DATA:  Recurrent acute pancreatitis. Severe abdominal and epigastric pain. Nausea. Elevated liver function tests. Biliary dilatation seen on recent ultrasound. Prior cholecystectomy.  EXAM: MRI ABDOMEN WITHOUT AND WITH CONTRAST (INCLUDING MRCP)  TECHNIQUE: Multiplanar multisequence MR imaging of the abdomen was performed both before and after the administration of intravenous contrast. Heavily T2-weighted images of the biliary and pancreatic ducts were obtained, and three-dimensional MRCP images were rendered by post processing.  CONTRAST:  20mL MULTIHANCE GADOBENATE DIMEGLUMINE 529 MG/ML IV SOLN  COMPARISON:  CT on 10/11/2014  FINDINGS: Lower chest: No acute findings.  Hepatobiliary: Mild diffuse hepatic steatosis demonstrated on chemical shift imaging. A tiny sub-cm cyst is seen in segment 8 of the right hepatic lobe, however no liver masses are identified.  Prior cholecystectomy noted. Mild biliary ductal dilatation is seen with common bile duct measuring 12 mm. No evidence of choledocholithiasis. Smooth tapering of the intrapancreatic portion of the distal common bile duct seen, over length of approximately 15 mm on image 43/series 8, suspicious for a distal common bile duct stricture related to pancreatitis.  Pancreas: Diffuse pancreatic swelling and mild peripancreatic inflammatory changes are seen, consistent with acute pancreatitis. No evidence of pancreatic mass or pancreatic ductal dilatation. No evidence of pancreatic necrosis or peripancreatic fluid collections.  Spleen:  Within normal limits in size and  appearance.  Adrenal Glands:  No masses identified.  Kidneys: No renal masses identified. No evidence of hydronephrosis.  Stomach/Bowel/Peritoneum: No evidence of wall thickening, mass, or obstruction involving visualized abdominal bowel.  Vascular/Lymphatic: Mild lymphadenopathy noted in the porta hepatis with largest lymph node measuring 2.0 cm on image 19/series 14. This is likely reactive in etiology given findings of acute pancreatitis. No other significant abnormality noted.  Other:  None.  Musculoskeletal:  No suspicious bone lesions identified.  IMPRESSION: Mild to moderate acute pancreatitis. No evidence of pancreatic necrosis, pseudocyst, or other pancreatic complication. No evidence of pancreatic ductal dilatation.  Prior cholecystectomy. Mild diffuse biliary ductal dilatation, with common bile duct measuring 12 mm. 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  Mild diffuse hepatic steatosis.  No evidence of hepatic neoplasm.  Mild porta hepatis lymphadenopathy, likely reactive in etiology given findings of acute pancreatitis.   Electronically Signed   By: Myles Rosenthal M.D.   On: 10/17/2014 08:59    Scheduled Meds: . buPROPion  150 mg Oral QPM  .  citalopram  40 mg Oral QPM  . enoxaparin (LOVENOX) injection  40 mg Subcutaneous Daily  . gabapentin  100-200 mg Oral QHS   Continuous Infusions: . sodium chloride 125 mL/hr at 10/16/14 2356    Principal Problem:   Acute pancreatitis Active Problems:   Elevated LFTs    Time spent: 25 min    [facility]  [facility] Pager [phone]. If 7PM-7AM, please contact night-coverage at [email or web address], password [facility] 10/17/2014, 6:08 PM  LOS: 1 day

## 2014-10-17 NOTE — Consult Note (Addendum)
Reason for Consult: Acute Pancreatitis Referring Physician: [facility]  Carley Hammed HPI: This is a 49 year old female admitted for acute pancreatitis.  She was recently admitted at [facility] and discharged home after a one day hospitalization.  The patient was subsequently referred to me and in the office yesterday she reported a worsening of her abdominal pain.  In fact, per her report, the pain never went away and it was exacerbated when she ate a bowel of cream of chicken.  She is s/p lap chole for symptomatic gallstones on 03/25/2009.  There is no history of ETOH use and all current work up is negative.  Her CBD on CT scan during her initial evaluation was measured to be 1.3 cm and the ultrasound now measures the CBD to be 1.5 cm.  Her liver enzymes have increased compared to her prior admission.  Past Medical History  Diagnosis Date  . Polymyalgia rheumatica   . Depressed     Past Surgical History  Procedure Laterality Date  . Cholecystectomy      Family History  Problem Relation Age of Onset  . Pancreatitis Neg Hx     Social History:  reports that she has never smoked. She does not have any smokeless tobacco history on file. She reports that she does not drink alcohol. Her drug history is not on file.  Allergies:  Allergies  Allergen Reactions  . Sulfa Antibiotics Hives    Medications:  Scheduled: . buPROPion  150 mg Oral QPM  . citalopram  40 mg Oral QPM  . enoxaparin (LOVENOX) injection  40 mg Subcutaneous Daily  . gabapentin  100-200 mg Oral QHS   Continuous: . sodium chloride 125 mL/hr at 10/16/14 2356    Results for orders placed or performed during the hospital encounter of 10/16/14 (from the past 24 hour(s))  Comprehensive metabolic panel     Status: Abnormal   Collection Time: 10/16/14  5:22 PM  Result Value Ref Range   Sodium 137 135 - 145 mmol/L   Potassium 4.2 3.5 - 5.1 mmol/L   Chloride 101 101 - 111 mmol/L   CO2 26 22 - 32 mmol/L   Glucose, Bld 121 (H) 65 - 99 mg/dL   BUN 6 6 - 20 mg/dL   Creatinine, Ser 4.16 0.44 - 1.00 mg/dL   Calcium 9.4 8.9 - 60.6 mg/dL   Total Protein 8.3 (H) 6.5 - 8.1 g/dL   Albumin 3.5 3.5 - 5.0 g/dL   AST 301 (H) 15 - 41 U/L   ALT 213 (H) 14 - 54 U/L   Alkaline Phosphatase 253 (H) 38 - 126 U/L   Total Bilirubin 1.6 (H) 0.3 - 1.2 mg/dL   GFR calc non Af Amer >60 >60 mL/min   GFR calc Af Amer >60 >60 mL/min   Anion gap 10 5 - 15  Lipase, blood     Status: Abnormal   Collection Time: 10/16/14  5:22 PM  Result Value Ref Range   Lipase 1056 (H) 22 - 51 U/L  CBC with Differential     Status: None   Collection Time: 10/16/14  5:22 PM  Result Value Ref Range   WBC 7.4 4.0 - 10.5 K/uL   RBC 4.59 3.87 - 5.11 MIL/uL   Hemoglobin 12.2 12.0 - 15.0 g/dL   HCT 60.1 09.3 - 23.5 %   MCV 83.7 78.0 - 100.0 fL   MCH 26.6 26.0 - 34.0 pg   MCHC 31.8 30.0 - 36.0  g/dL   RDW 04.5 40.9 - 81.1 %   Platelets 329 150 - 400 K/uL   Neutrophils Relative % 64 43 - 77 %   Neutro Abs 4.7 1.7 - 7.7 K/uL   Lymphocytes Relative 28 12 - 46 %   Lymphs Abs 2.1 0.7 - 4.0 K/uL   Monocytes Relative 7 3 - 12 %   Monocytes Absolute 0.5 0.1 - 1.0 K/uL   Eosinophils Relative 1 0 - 5 %   Eosinophils Absolute 0.1 0.0 - 0.7 K/uL   Basophils Relative 0 0 - 1 %   Basophils Absolute 0.0 0.0 - 0.1 K/uL  Amylase     Status: Abnormal   Collection Time: 10/16/14  5:22 PM  Result Value Ref Range   Amylase 280 (H) 28 - 100 U/L  Glucose, capillary     Status: Abnormal   Collection Time: 10/16/14 11:42 PM  Result Value Ref Range   Glucose-Capillary 117 (H) 65 - 99 mg/dL  Hepatic function panel     Status: Abnormal   Collection Time: 10/17/14  1:02 AM  Result Value Ref Range   Total Protein 7.6 6.5 - 8.1 g/dL   Albumin 3.2 (L) 3.5 - 5.0 g/dL   AST 914 (H) 15 - 41 U/L   ALT 235 (H) 14 - 54 U/L   Alkaline Phosphatase 245 (H) 38 - 126 U/L   Total Bilirubin 2.4 (H) 0.3 - 1.2 mg/dL   Bilirubin, Direct 1.3 (H) 0.1 - 0.5 mg/dL    Indirect Bilirubin 1.1 (H) 0.3 - 0.9 mg/dL  CBC with Differential/Platelet     Status: Abnormal   Collection Time: 10/17/14  1:02 AM  Result Value Ref Range   WBC 9.3 4.0 - 10.5 K/uL   RBC 4.29 3.87 - 5.11 MIL/uL   Hemoglobin 11.4 (L) 12.0 - 15.0 g/dL   HCT 78.2 95.6 - 21.3 %   MCV 83.9 78.0 - 100.0 fL   MCH 26.6 26.0 - 34.0 pg   MCHC 31.7 30.0 - 36.0 g/dL   RDW 08.6 57.8 - 46.9 %   Platelets 311 150 - 400 K/uL   Neutrophils Relative % 68 43 - 77 %   Neutro Abs 6.4 1.7 - 7.7 K/uL   Lymphocytes Relative 23 12 - 46 %   Lymphs Abs 2.1 0.7 - 4.0 K/uL   Monocytes Relative 8 3 - 12 %   Monocytes Absolute 0.7 0.1 - 1.0 K/uL   Eosinophils Relative 1 0 - 5 %   Eosinophils Absolute 0.1 0.0 - 0.7 K/uL   Basophils Relative 0 0 - 1 %   Basophils Absolute 0.0 0.0 - 0.1 K/uL  Basic metabolic panel     Status: Abnormal   Collection Time: 10/17/14  1:02 AM  Result Value Ref Range   Sodium 135 135 - 145 mmol/L   Potassium 4.5 3.5 - 5.1 mmol/L   Chloride 99 (L) 101 - 111 mmol/L   CO2 27 22 - 32 mmol/L   Glucose, Bld 126 (H) 65 - 99 mg/dL   BUN 6 6 - 20 mg/dL   Creatinine, Ser 6.29 0.44 - 1.00 mg/dL   Calcium 8.9 8.9 - 52.8 mg/dL   GFR calc non Af Amer >60 >60 mL/min   GFR calc Af Amer >60 >60 mL/min   Anion gap 9 5 - 15  Lipid panel     Status: Abnormal   Collection Time: 10/17/14  1:02 AM  Result Value Ref Range   Cholesterol 232 (H)  0 - 200 mg/dL   Triglycerides 64 <161 mg/dL   HDL 65 >09 mg/dL   Total CHOL/HDL Ratio 3.6 RATIO   VLDL 13 0 - 40 mg/dL   LDL Cholesterol 604 (H) 0 - 99 mg/dL  Troponin I     Status: None   Collection Time: 10/17/14  1:02 AM  Result Value Ref Range   Troponin I <0.03 <0.031 ng/mL  Glucose, capillary     Status: Abnormal   Collection Time: 10/17/14  6:27 AM  Result Value Ref Range   Glucose-Capillary 113 (H) 65 - 99 mg/dL     US Abdomen Complete  10/17/2014   CLINICAL DATA:  Acute onset of generalized abdominal pain. Initial encounter.  EXAM:  ULTRASOUND ABDOMEN COMPLETE  COMPARISON:  CT of the abdomen pelvis from 10/11/2014, and abdominal ultrasound performed 03/13/2009  FINDINGS: Gallbladder: Status post cholecystectomy.  No retained stones seen.  Common bile duct: Diameter: 1.5 cm; this is only minimally changed from the prior CT, at which time it measured 1.3 cm. This may reflect the patient's underlying acute pancreatitis.  Liver: No focal lesion identified. Coarsened echotexture likely reflects diffuse fatty infiltration.  IVC: No abnormality visualized.  Pancreas: Not visualized on this study.  Spleen: Size and appearance within normal limits.  Right Kidney: Length: 10.9 cm. Echogenicity within normal limits. No mass or hydronephrosis visualized.  Left Kidney: Length: 10.5 cm. Echogenicity within normal limits. No mass or hydronephrosis visualized.  Abdominal aorta: No aneurysm visualized.  Other findings: None.  IMPRESSION: 1. Common bile duct measures 1.5 cm; this is only minimally changed from the prior CT, at which time it measured 1.3 cm. This may reflect the patient's underlying known acute pancreatitis. The patient is status post cholecystectomy. 2. Diffuse fatty infiltration within the liver.   Electronically Signed   By: Roanna Raider M.D.   On: 10/17/2014 01:54    ROS:  As stated above in the HPI otherwise negative.  Blood pressure 107/66, pulse 68, temperature 97.5 F (36.4 C), temperature source Oral, resp. rate 15, height  (1.676 m), weight 132.45 kg (292 lb), SpO2 94 %.    PE: Gen: NAD, Alert and Oriented HEENT:  [place]/AT, EOMI Neck: Supple, no LAD Lungs: CTA Bilaterally CV: RRR without M/G/R ABM: Soft, NTND, +BS Ext: No C/C/E  Assessment/Plan: 1) Acute pancreatitis. 2) Morbid obesity. 3) Elevated liver enzymes.   From what I can gather, I think the patient was too anxious to leave the hospital.  Her pancreatitis never resolved.  She does have a history of gallstones and the elevation in her ALT suggests a  gallstone pancreatitis.  The increase in her CBD diameter may represent obstruction versus a component of obstruction for pancreatic edema as a result of pancreatitis.  The CBD diameter at 1.3 cm is expected with her post cholecystectomy state.  Her liver enzymes were dropping earlier this week.  Plan: 1) IV hydration. 2) Pain control. 3) Strict NPO. 4) Agree with MRCP. 5) Follow liver panel.   ADDENDUM:  I reviewed her MRCP.  There is no evidence of choledocholithiasis.  There is a mild stricture in the intrapancreatic portion of the CBD, which was my suspicion.  This is most likely the reason for her elevated liver enzymes.  I cannot prove it at this time, but I think she had a gallstone pancreatitis during the initial presentation and past the small stones/sludge.  Supportive care is all that is required at this time.  In the future, I  will perform an EUS for further evaluation of any intrapancreatic source of her pancreatitis and for any evidence of microlithiasis in the CBD.  I will be out of town starting today, but Dr. Loreta Ave will be available.  However, at this juncture, I think Hospitalist management will be more than adequate.    Recommendations: 1) Strict NPO today.   2) Diet can be advanced tomorrow if she does not have any significant pain starting with clear liquids.  I would not advance too quickly. 3) Liberal pain control. 4) The time for discharge will be when she does not have any pain with a low fat diet.  I am anticipating this point to be on Sunday. 5) Continue to follow liver panel and creatinine.  I am anticipating that her liver enzymes will peak and then decline with resolution or improvement of pancreatic edema. Nyjah Denio D 10/17/2014, 6:37 AM

## 2014-10-18 LAB — COMPREHENSIVE METABOLIC PANEL
ALT: 195 U/L — ABNORMAL HIGH (ref 14–54)
ANION GAP: 8 (ref 5–15)
AST: 108 U/L — AB (ref 15–41)
Albumin: 3.2 g/dL — ABNORMAL LOW (ref 3.5–5.0)
Alkaline Phosphatase: 237 U/L — ABNORMAL HIGH (ref 38–126)
BUN: 12 mg/dL (ref 6–20)
CALCIUM: 9 mg/dL (ref 8.9–10.3)
CHLORIDE: 102 mmol/L (ref 101–111)
CO2: 26 mmol/L (ref 22–32)
CREATININE: 0.85 mg/dL (ref 0.44–1.00)
GFR calc non Af Amer: >60 mL/min (ref >60)
Glucose, Bld: 89 mg/dL (ref 65–99)
Potassium: 4.4 mmol/L (ref 3.5–5.1)
Sodium: 136 mmol/L (ref 135–145)
Total Bilirubin: 0.7 mg/dL (ref 0.3–1.2)
Total Protein: 7.8 g/dL (ref 6.5–8.1)

## 2014-10-18 LAB — GLUCOSE, CAPILLARY
GLUCOSE-CAPILLARY: 81 mg/dL (ref 65–99)
GLUCOSE-CAPILLARY: 77 mg/dL (ref 65–99)
GLUCOSE-CAPILLARY: 75 mg/dL (ref 65–99)
Glucose-Capillary: 84 mg/dL (ref 65–99)

## 2014-10-18 LAB — LIPASE, BLOOD: LIPASE: 92 U/L — AB (ref 22–51)

## 2014-10-18 MED ORDER — SODIUM CHLORIDE 0.9 % IV SOLN
INTRAVENOUS | Status: DC
  Administered 2014-10-18: 10:00:00 via INTRAVENOUS
  Administered 2014-10-18: 100 mL/h via INTRAVENOUS
  Administered 2014-10-19: 06:00:00 via INTRAVENOUS
  Administered 2014-10-19: 100 mL/h via INTRAVENOUS

## 2014-10-18 NOTE — Progress Notes (Signed)
[facility] PROGRESS NOTE  Hannah Lin SWN:[id number] DOB: [birth year] DOA: 10/16/2014 PCP: Pamelia Hoit, MD  Assessment/Plan: 1. Acute pancreatitis- patient presented with acute mental otitis with elevated lipase, MRCP done this morning shows mild dilation of CBD. Discussed with Dr. Elnoria Howard who thinks that this could be from the because of the pancreatic edema causing the obstruction. Lipase is 92, AST 108, ALT 195. Continue with npo and IV fluids. 2. Anxiety- continue clonazepam 0.5 mg 3 times a day when necessary, continue Celexa 40 mg daily, Wellbutrin 150 mg by mouth daily  Code Status: Full code Family Communication: *Discussed with son at bedside Disposition Plan: Home when medically stable   Consultants:  GI  Procedures:  None  Antibiotics:  None  HPI/Subjective: 49 y.o. female with history of recurrent pancreatitis who was recently admitted at [facility] for acute pancreatitis and just discharged 3 days ago presents to the ER because of severe abdominal pain. Patient is in the epigastric area radiating to the back stabbing in nature with some nausea denies any vomiting or diarrhea. Denies any chest pain or shortness of breath. In the ER patient's lipase and LFTs are elevated. Patient after her last discharge had appointment with Dr. Elnoria Howard gastroenterologist today and at the office patient started developing severe pain and was referred to the ER. Sonogram of the abdomen is pending. Patient denies having any new medications and denies taking alcohol.  This morning feels better, lipase is down to 92, LFT's are trending down.  Objective: Filed Vitals:   10/18/14 0723  BP: 130/67  Pulse: 76  Temp: 97.9 F (36.6 C)  Resp: 17    Intake/Output Summary (Last 24 hours) at 10/18/14 1431 Last data filed at 10/18/14 0600  Gross per 24 hour  Intake     80 ml  Output    300 ml  Net   -220 ml   Filed Weights   10/16/14 2308 10/18/14 0500  Weight: 132.45 kg  (292 lb) 132.4 kg (291 lb 14.2 oz)    Exam:   General:  Appear in no acute distress  Cardiovascular: S1s2 RRR  Respiratory: Clear bilaterally  Abdomen: Soft, nontender, no organomegaly  Musculoskeletal: No edema of the lower extremities   Data Reviewed: Basic Metabolic Panel:  Recent Labs Lab 10/12/14 0623 10/13/14 0610 10/16/14 1722 10/17/14 0102 10/18/14 0516  NA 138 139 137 135 136  K 3.5 3.8 4.2 4.5 4.4  CL 104 104 101 99* 102  CO2 27 28 26 27 26   GLUCOSE 101* 120* 121* 126* 89  BUN 12 6 6 6 12   CREATININE 0.59 0.65 0.85 0.82 0.85  CALCIUM 8.2* 8.4* 9.4 8.9 9.0   Liver Function Tests:  Recent Labs Lab 10/12/14 0623 10/13/14 0610 10/16/14 1722 10/17/14 0102 10/18/14 0516  AST 244* 91* 246* 257* 108*  ALT 169* 130* 213* 235* 195*  ALKPHOS 125 129* 253* 245* 237*  BILITOT 1.3* 0.5 1.6* 2.4* 0.7  PROT 7.2 7.0 8.3* 7.6 7.8  ALBUMIN 3.2* 3.1* 3.5 3.2* 3.2*    Recent Labs Lab 10/11/14 2033 10/12/14 0623 10/13/14 0610 10/16/14 1722 10/18/14 0516  LIPASE >3000* 1585* 188* 1056* 92*  AMYLASE  --   --   --  280*  --    No results for input(s): AMMONIA in the last 168 hours. CBC:  Recent Labs Lab 10/11/14 2033 10/12/14 0623 10/13/14 0610 10/16/14 1722 10/17/14 0102  WBC 8.9 8.2 8.1 7.4 9.3  NEUTROABS 5.6 5.2 5.3 4.7 6.4  HGB 11.4* 11.1* 10.7* 12.2 11.4*  HCT 36.5 36.1 35.2* 38.4 36.0  MCV 86.1 86.0 86.7 83.7 83.9  PLT 268 267 215 329 311   Cardiac Enzymes:  Recent Labs Lab 10/11/14 2033 10/17/14 0102  TROPONINI <0.03 <0.03   BNP (last 3 results) No results for input(s): BNP in the last 8760 hours.  ProBNP (last 3 results) No results for input(s): PROBNP in the last 8760 hours.  CBG:  Recent Labs Lab 10/17/14 1152 10/17/14 1707 10/17/14 2342 10/18/14 0559 10/18/14 1225  GLUCAP 102* 85 85 81 77    No results found for this or any previous visit (from the past 240 hour(s)).   Studies: US Abdomen Complete  10/17/2014    CLINICAL DATA:  Acute onset of generalized abdominal pain. Initial encounter.  EXAM: ULTRASOUND ABDOMEN COMPLETE  COMPARISON:  CT of the abdomen pelvis from 10/11/2014, and abdominal ultrasound performed 03/13/2009  FINDINGS: Gallbladder: Status post cholecystectomy.  No retained stones seen.  Common bile duct: Diameter: 1.5 cm; this is only minimally changed from the prior CT, at which time it measured 1.3 cm. This may reflect the patient's underlying acute pancreatitis.  Liver: No focal lesion identified. Coarsened echotexture likely reflects diffuse fatty infiltration.  IVC: No abnormality visualized.  Pancreas: Not visualized on this study.  Spleen: Size and appearance within normal limits.  Right Kidney: Length: 10.9 cm. Echogenicity within normal limits. No mass or hydronephrosis visualized.  Left Kidney: Length: 10.5 cm. Echogenicity within normal limits. No mass or hydronephrosis visualized.  Abdominal aorta: No aneurysm visualized.  Other findings: None.  IMPRESSION: 1. Common bile duct measures 1.5 cm; this is only minimally changed from the prior CT, at which time it measured 1.3 cm. This may reflect the patient's underlying known acute pancreatitis. The patient is status post cholecystectomy. 2. Diffuse fatty infiltration within the liver.   Electronically Signed   By: Roanna Raider M.D.   On: 10/17/2014 01:54   Mr 3d Recon At Scanner  10/17/2014   CLINICAL DATA:  Recurrent acute pancreatitis. Severe abdominal and epigastric pain. Nausea. Elevated liver function tests. Biliary dilatation seen on recent ultrasound. Prior cholecystectomy.  EXAM: MRI ABDOMEN WITHOUT AND WITH CONTRAST (INCLUDING MRCP)  TECHNIQUE: Multiplanar multisequence MR imaging of the abdomen was performed both before and after the administration of intravenous contrast. Heavily T2-weighted images of the biliary and pancreatic ducts were obtained, and three-dimensional MRCP images were rendered by post processing.  CONTRAST:  20mL  MULTIHANCE GADOBENATE DIMEGLUMINE 529 MG/ML IV SOLN  COMPARISON:  CT on 10/11/2014  FINDINGS: Lower chest: No acute findings.  Hepatobiliary: Mild diffuse hepatic steatosis demonstrated on chemical shift imaging. A tiny sub-cm cyst is seen in segment 8 of the right hepatic lobe, however no liver masses are identified.  Prior cholecystectomy noted. Mild biliary ductal dilatation is seen with common bile duct measuring 12 mm. No evidence of choledocholithiasis. Smooth tapering of the intrapancreatic portion of the distal common bile duct seen, over length of approximately 15 mm on image 43/series 8, suspicious for a distal common bile duct stricture related to pancreatitis.  Pancreas: Diffuse pancreatic swelling and mild peripancreatic inflammatory changes are seen, consistent with acute pancreatitis. No evidence of pancreatic mass or pancreatic ductal dilatation. No evidence of pancreatic necrosis or peripancreatic fluid collections.  Spleen:  Within normal limits in size and appearance.  Adrenal Glands:  No masses identified.  Kidneys: No renal masses identified. No evidence of hydronephrosis.  Stomach/Bowel/Peritoneum: No evidence of wall thickening, mass, or  obstruction involving visualized abdominal bowel.  Vascular/Lymphatic: Mild lymphadenopathy noted in the porta hepatis with largest lymph node measuring 2.0 cm on image 19/series 14. This is likely reactive in etiology given findings of acute pancreatitis. No other significant abnormality noted.  Other:  None.  Musculoskeletal:  No suspicious bone lesions identified.  IMPRESSION: Mild to moderate acute pancreatitis. No evidence of pancreatic necrosis, pseudocyst, or other pancreatic complication. No evidence of pancreatic ductal dilatation.  Prior cholecystectomy. Mild diffuse biliary ductal dilatation, with common bile duct measuring 12 mm. No evidence of choledocholithiasis, however there is smooth narrowing of the intrapancreatic portion of the distal  common bile duct measuring approximately 15 mm in length, suspicious for distal common bile duct stricture secondary to pancreatitis.  Mild diffuse hepatic steatosis.  No evidence of hepatic neoplasm.  Mild porta hepatis lymphadenopathy, likely reactive in etiology given findings of acute pancreatitis.   Electronically Signed   By: Myles Rosenthal M.D.   On: 10/17/2014 08:59   Mr Abd W/wo Cm/mrcp  10/17/2014   CLINICAL DATA:  Recurrent acute pancreatitis. Severe abdominal and epigastric pain. Nausea. Elevated liver function tests. Biliary dilatation seen on recent ultrasound. Prior cholecystectomy.  EXAM: MRI ABDOMEN WITHOUT AND WITH CONTRAST (INCLUDING MRCP)  TECHNIQUE: Multiplanar multisequence MR imaging of the abdomen was performed both before and after the administration of intravenous contrast. Heavily T2-weighted images of the biliary and pancreatic ducts were obtained, and three-dimensional MRCP images were rendered by post processing.  CONTRAST:  20mL MULTIHANCE GADOBENATE DIMEGLUMINE 529 MG/ML IV SOLN  COMPARISON:  CT on 10/11/2014  FINDINGS: Lower chest: No acute findings.  Hepatobiliary: Mild diffuse hepatic steatosis demonstrated on chemical shift imaging. A tiny sub-cm cyst is seen in segment 8 of the right hepatic lobe, however no liver masses are identified.  Prior cholecystectomy noted. Mild biliary ductal dilatation is seen with common bile duct measuring 12 mm. No evidence of choledocholithiasis. Smooth tapering of the intrapancreatic portion of the distal common bile duct seen, over length of approximately 15 mm on image 43/series 8, suspicious for a distal common bile duct stricture related to pancreatitis.  Pancreas: Diffuse pancreatic swelling and mild peripancreatic inflammatory changes are seen, consistent with acute pancreatitis. No evidence of pancreatic mass or pancreatic ductal dilatation. No evidence of pancreatic necrosis or peripancreatic fluid collections.  Spleen:  Within normal  limits in size and appearance.  Adrenal Glands:  No masses identified.  Kidneys: No renal masses identified. No evidence of hydronephrosis.  Stomach/Bowel/Peritoneum: No evidence of wall thickening, mass, or obstruction involving visualized abdominal bowel.  Vascular/Lymphatic: Mild lymphadenopathy noted in the porta hepatis with largest lymph node measuring 2.0 cm on image 19/series 14. This is likely reactive in etiology given findings of acute pancreatitis. No other significant abnormality noted.  Other:  None.  Musculoskeletal:  No suspicious bone lesions identified.  IMPRESSION: Mild to moderate acute pancreatitis. No evidence of pancreatic necrosis, pseudocyst, or other pancreatic complication. No evidence of pancreatic ductal dilatation.  Prior cholecystectomy. Mild diffuse biliary ductal dilatation, with common bile duct measuring 12 mm. 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  Mild diffuse hepatic steatosis.  No evidence of hepatic neoplasm.  Mild porta hepatis lymphadenopathy, likely reactive in etiology given findings of acute pancreatitis.   Electronically Signed   By: Myles Rosenthal M.D.   On: 10/17/2014 08:59  Scheduled Meds: . buPROPion  150 mg Oral QPM  . citalopram  40 mg Oral QPM  . enoxaparin (LOVENOX) injection  40 mg Subcutaneous Daily  . gabapentin  100-200 mg Oral QHS   Continuous Infusions: . sodium chloride 100 mL/hr at 10/18/14 1016    Principal Problem:   Acute pancreatitis Active Problems:   Elevated LFTs    Time spent: 25 min    [facility]  [facility] Pager [phone]. If 7PM-7AM, please contact night-coverage at [email or web address], password [facility] 10/18/2014, 2:31 PM  LOS: 2 days

## 2014-10-19 LAB — COMPREHENSIVE METABOLIC PANEL
ALT: 115 U/L — ABNORMAL HIGH (ref 14–54)
AST: 37 U/L (ref 15–41)
Albumin: 2.8 g/dL — ABNORMAL LOW (ref 3.5–5.0)
Alkaline Phosphatase: 176 U/L — ABNORMAL HIGH (ref 38–126)
Anion gap: 10 (ref 5–15)
BUN: 9 mg/dL (ref 6–20)
CALCIUM: 8.6 mg/dL — AB (ref 8.9–10.3)
CO2: 27 mmol/L (ref 22–32)
Chloride: 98 mmol/L — ABNORMAL LOW (ref 101–111)
Creatinine, Ser: 0.67 mg/dL (ref 0.44–1.00)
GLUCOSE: 84 mg/dL (ref 65–99)
Potassium: 3.8 mmol/L (ref 3.5–5.1)
SODIUM: 135 mmol/L (ref 135–145)
Total Bilirubin: 0.8 mg/dL (ref 0.3–1.2)
Total Protein: 7.4 g/dL (ref 6.5–8.1)

## 2014-10-19 LAB — GLUCOSE, CAPILLARY
Glucose-Capillary: 65 mg/dL (ref 65–99)
Glucose-Capillary: 82 mg/dL (ref 65–99)

## 2014-10-19 NOTE — Progress Notes (Signed)
[facility] PROGRESS NOTE  Hannah Lin:[id number] DOB: 1965/12/26 DOA: 10/16/2014 PCP: Pamelia Hoit, MD  Assessment/Plan: 1. Acute pancreatitis- patient presented with acute mental otitis with elevated lipase, MRCP morning showed mild dilation of CBD. Discussed with Dr. Elnoria Howard who thinks that this could be from the because of the pancreatic edema causing the obstruction. Patient was started  On IV fluids and Nothing by mouth, at this time liver enzymes have improved AST is 37 ALT still 115. Will start patient on clear liquid diet. Her lipase came down to 92. 2. Anxiety- continue clonazepam 0.5 mg 3 times a day when necessary, continue Celexa 40 mg daily, Wellbutrin 150 mg by mouth daily  Code Status: Full code Family Communication: *Discussed with son at bedside Disposition Plan: Home when medically stable   Consultants:  GI  Procedures:  None  Antibiotics:  None  HPI/Subjective: 49 y.o. female with history of recurrent pancreatitis who was recently admitted at [facility] for acute pancreatitis and just discharged 3 days ago presents to the ER because of severe abdominal pain. Patient is in the epigastric area radiating to the back stabbing in nature with some nausea denies any vomiting or diarrhea. Denies any chest pain or shortness of breath. In the ER patient's lipase and LFTs are elevated. Patient after her last discharge had appointment with Dr. Elnoria Howard gastroenterologist today and at the office patient started developing severe pain and was referred to the ER. Sonogram of the abdomen is pending. Patient denies having any new medications and denies taking alcohol.  This morning feels better, wants to try diet.  Objective: Filed Vitals:   10/19/14 0941  BP: 114/58  Pulse: 68  Temp: 98.5 F (36.9 C)  Resp: 18    Intake/Output Summary (Last 24 hours) at 10/19/14 1318 Last data filed at 10/19/14 0900  Gross per 24 hour  Intake   1220 ml  Output      0  ml  Net   1220 ml   Filed Weights   10/16/14 2308 10/18/14 0500 10/18/14 2146  Weight: 132.45 kg (292 lb) 132.4 kg (291 lb 14.2 oz) 119.069 kg (262 lb 8 oz)    Exam:   General:  Appear in no acute distress  Cardiovascular: S1s2 RRR  Respiratory: Clear bilaterally  Abdomen: Soft, mild epigastric tenderness to palpation, no organomegaly  Musculoskeletal: No edema of the lower extremities   Data Reviewed: Basic Metabolic Panel:  Recent Labs Lab 10/13/14 0610 10/16/14 1722 10/17/14 0102 10/18/14 0516 10/19/14 0637  NA 139 137 135 136 135  K 3.8 4.2 4.5 4.4 3.8  CL 104 101 99* 102 98*  CO2 GLUCOSE 120* 121* 126* 89 84  BUN CREATININE 0.65 0.85 0.82 0.85 0.67  CALCIUM 8.4* 9.4 8.9 9.0 8.6*   Liver Function Tests:  Recent Labs Lab 10/13/14 0610 10/16/14 1722 10/17/14 0102 10/18/14 0516 10/19/14 0637  AST 91* 246* 257* 108* 37  ALT 130* 213* 235* 195* 115*  ALKPHOS 129* 253* 245* 237* 176*  BILITOT 0.5 1.6* 2.4* 0.7 0.8  PROT 7.0 8.3* 7.6 7.8 7.4  ALBUMIN 3.1* 3.5 3.2* 3.2* 2.8*    Recent Labs Lab 10/13/14 0610 10/16/14 1722 10/18/14 0516  LIPASE 188* 1056* 92*  AMYLASE  --  280*  --    No results for input(s): AMMONIA in the last 168 hours. CBC:  Recent Labs Lab 10/13/14 0610 10/16/14 1722 10/17/14 0102  WBC  8.1 7.4 9.3  NEUTROABS 5.3 4.7 6.4  HGB 10.7* 12.2 11.4*  HCT 35.2* 38.4 36.0  MCV 86.7 83.7 83.9  PLT 215 329 311   Cardiac Enzymes:  Recent Labs Lab 10/17/14 0102  TROPONINI <0.03   BNP (last 3 results) No results for input(s): BNP in the last 8760 hours.  ProBNP (last 3 results) No results for input(s): PROBNP in the last 8760 hours.  CBG:  Recent Labs Lab 10/18/14 0559 10/18/14 1225 10/18/14 1734 10/18/14 2351 10/19/14 1130  GLUCAP 81 77 75 84 65    No results found for this or any previous visit (from the past 240 hour(s)).   Studies: No results found.  Scheduled Meds: .  buPROPion  150 mg Oral QPM  . citalopram  40 mg Oral QPM  . enoxaparin (LOVENOX) injection  40 mg Subcutaneous Daily  . gabapentin  100-200 mg Oral QHS   Continuous Infusions: . sodium chloride 100 mL/hr at 10/19/14 9024    Principal Problem:   Acute pancreatitis Active Problems:   Elevated LFTs    Time spent: 25 min    [facility]  [facility] Pager [phone]. If 7PM-7AM, please contact night-coverage at [email or web address], password [facility] 10/19/2014, 1:18 PM  LOS: 3 days

## 2014-10-20 LAB — COMPREHENSIVE METABOLIC PANEL
ALBUMIN: 2.7 g/dL — AB (ref 3.5–5.0)
ALK PHOS: 151 U/L — AB (ref 38–126)
ALT: 81 U/L — ABNORMAL HIGH (ref 14–54)
AST: 21 U/L (ref 15–41)
Anion gap: 7 (ref 5–15)
BILIRUBIN TOTAL: 0.8 mg/dL (ref 0.3–1.2)
CHLORIDE: 103 mmol/L (ref 101–111)
CO2: 28 mmol/L (ref 22–32)
Calcium: 8.8 mg/dL — ABNORMAL LOW (ref 8.9–10.3)
Creatinine, Ser: 0.62 mg/dL (ref 0.44–1.00)
GFR calc Af Amer: >60 mL/min (ref >60)
GFR calc non Af Amer: >60 mL/min (ref >60)
GLUCOSE: 106 mg/dL — AB (ref 65–99)
POTASSIUM: 3.6 mmol/L (ref 3.5–5.1)
SODIUM: 138 mmol/L (ref 135–145)
Total Protein: 7.8 g/dL (ref 6.5–8.1)

## 2014-10-20 LAB — GLUCOSE, CAPILLARY
GLUCOSE-CAPILLARY: 94 mg/dL (ref 65–99)
Glucose-Capillary: 93 mg/dL (ref 65–99)
Glucose-Capillary: 98 mg/dL (ref 65–99)

## 2014-10-20 MED ORDER — OXYCODONE-ACETAMINOPHEN 5-325 MG PO TABS: 1.0000 | ORAL_TABLET | ORAL | Status: DC | PRN

## 2014-10-20 NOTE — Progress Notes (Signed)
Hannah Lin to be D/C'd Home per MD order.  Discussed prescriptions and follow up appointments with the patient. Prescriptions given to patient, medication list explained in detail. Pt verbalized understanding.    Medication List    TAKE these medications        amphetamine-dextroamphetamine 30 MG tablet  Commonly known as:  ADDERALL  Take 30 mg by mouth 2 (two) times daily.     buPROPion 150 MG 24 hr tablet  Commonly known as:  WELLBUTRIN XL  Take 150 mg by mouth every evening.     citalopram 40 MG tablet  Commonly known as:  CELEXA  Take 40 mg by mouth every evening.     clonazePAM 0.5 MG tablet  Commonly known as:  KLONOPIN  Take 0.5 mg by mouth 3 (three) times daily as needed for anxiety.     gabapentin 100 MG capsule  Commonly known as:  NEURONTIN  Take 100-200 mg by mouth at bedtime.     omeprazole 20 MG capsule  Commonly known as:  PRILOSEC  Take 20 mg by mouth every morning.     oxyCODONE-acetaminophen 5-325 MG per tablet  Commonly known as:  ROXICET  Take 1 tablet by mouth every 4 (four) hours as needed for severe pain.        Filed Vitals:   10/20/14 0948  BP: 142/75  Pulse: 70  Temp: 98.2 F (36.8 C)  Resp: 18    Skin clean, dry and intact without evidence of skin break down, no evidence of skin tears noted. IV catheter discontinued intact. Site without signs and symptoms of complications. Dressing and pressure applied. Pt denies pain at this time. No complaints noted.  An After Visit Summary was printed and given to the patient. Patient escorted via WC, and D/C home via private auto.  Modena Nunnery C 10/20/2014 3:56 PM

## 2014-10-20 NOTE — Discharge Summary (Signed)
Physician Discharge Summary  Hannah Lin ZOX:[id number] DOB: 1965-08-04 DOA: 10/16/2014  PCP: Pamelia Hoit, MD  Admit date: 10/16/2014 Discharge date: 10/20/2014  Time spent: 25* minutes  Recommendations for Outpatient Follow-up:  1. *Follow up GI in one week 2. Follow up PCP in 2 weeks  Discharge Diagnoses:  Principal Problem:   Acute pancreatitis Active Problems:   Elevated LFTs   Discharge Condition: Stable  Diet recommendation: Low fat diet  Filed Weights   10/18/14 2146 10/19/14 2104 10/20/14 0500  Weight: 119.069 kg (262 lb 8 oz) 131.6 kg (290 lb 2 oz) 131.6 kg (290 lb 2 oz)    History of present illness:  49 y.o. female with history of recurrent pancreatitis who was recently admitted at [facility] for acute pancreatitis and just discharged 3 days ago presents to the ER because of severe abdominal pain. Patient is in the epigastric area radiating to the back stabbing in nature with some nausea denies any vomiting or diarrhea. Denies any chest pain or shortness of breath. In the ER patient's lipase and LFTs are elevated. Patient after her last discharge had appointment with Dr. Elnoria Howard gastroenterologist today and at the office patient started developing severe pain and was referred to the ER. Sonogram of the abdomen is pending. Patient denies having any new medications and denies taking alcohol.  Hospital Course:  1. Acute pancreatitis- patient presented with acute mental otitis with elevated lipase, MRCP showed mild dilation of CBD. Discussed with Dr. Elnoria Howard who thinks that this could be from the because of the pancreatic edema causing the obstruction. Patient was startedon IV fluids and Nothing by mouth, at this time liver enzymes have improved AST is 21 ALT is 81. Patient was started on clear liquid diet , and is tolerating it well. He Her lipase came down to 92. 2. Anxiety- continue clonazepam 0.5 mg 3 times a day when necessary, continue Celexa 40 mg daily,  Wellbutrin 150 mg by mouth daily  Procedures:  MRCP  Consultations:  GI  Discharge Exam: Filed Vitals:   10/20/14 0948  BP: 142/75  Pulse: 70  Temp: 98.2 F (36.8 C)  Resp: 18    General: Appear in no acute distress Cardiovascular: S1S2 RRR Respiratory: Clear bilaterally  Discharge Instructions    Current Discharge Medication List    START taking these medications   Details  oxyCODONE-acetaminophen (ROXICET) 5-325 MG per tablet Take 1 tablet by mouth every 4 (four) hours as needed for severe pain. Qty: 14 tablet, Refills: 0      CONTINUE these medications which have NOT CHANGED   Details  amphetamine-dextroamphetamine (ADDERALL) 30 MG tablet Take 30 mg by mouth 2 (two) times daily.    buPROPion (WELLBUTRIN XL) 150 MG 24 hr tablet Take 150 mg by mouth every evening.    citalopram (CELEXA) 40 MG tablet Take 40 mg by mouth every evening.    clonazePAM (KLONOPIN) 0.5 MG tablet Take 0.5 mg by mouth 3 (three) times daily as needed for anxiety.    gabapentin (NEURONTIN) 100 MG capsule Take 100-200 mg by mouth at bedtime.    omeprazole (PRILOSEC) 20 MG capsule Take 20 mg by mouth every morning.       Allergies  Allergen Reactions  . Sulfa Antibiotics Hives      The results of significant diagnostics from this hospitalization (including imaging, microbiology, ancillary and laboratory) are listed below for reference.    Significant Diagnostic Studies: US Abdomen Complete  10/17/2014   CLINICAL DATA:  Acute onset of generalized abdominal pain. Initial encounter.  EXAM: ULTRASOUND ABDOMEN COMPLETE  COMPARISON:  CT of the abdomen pelvis from 10/11/2014, and abdominal ultrasound performed 03/13/2009  FINDINGS: Gallbladder: Status post cholecystectomy.  No retained stones seen.  Common bile duct: Diameter: 1.5 cm; this is only minimally changed from the prior CT, at which time it measured 1.3 cm. This may reflect the patient's underlying acute pancreatitis.  Liver: No  focal lesion identified. Coarsened echotexture likely reflects diffuse fatty infiltration.  IVC: No abnormality visualized.  Pancreas: Not visualized on this study.  Spleen: Size and appearance within normal limits.  Right Kidney: Length: 10.9 cm. Echogenicity within normal limits. No mass or hydronephrosis visualized.  Left Kidney: Length: 10.5 cm. Echogenicity within normal limits. No mass or hydronephrosis visualized.  Abdominal aorta: No aneurysm visualized.  Other findings: None.  IMPRESSION: 1. Common bile duct measures 1.5 cm; this is only minimally changed from the prior CT, at which time it measured 1.3 cm. This may reflect the patient's underlying known acute pancreatitis. The patient is status post cholecystectomy. 2. Diffuse fatty infiltration within the liver.   Electronically Signed   By: Roanna Raider M.D.   On: 10/17/2014 01:54   Ct Abdomen Pelvis W Contrast  10/12/2014   CLINICAL DATA:  Chronic severe upper abdominal pain, extending to the back. Status post fall. Initial encounter.  EXAM: CT ABDOMEN AND PELVIS WITH CONTRAST  TECHNIQUE: Multidetector CT imaging of the abdomen and pelvis was performed using the standard protocol following bolus administration of intravenous contrast.  CONTRAST:  OMNIPAQUE IOHEXOL 300 MG/ML  SOLN  COMPARISON:  CT of the abdomen and pelvis from 01/20/2006, abdominal ultrasound performed 03/13/2009, and MRI of the lumbar spine performed 10/09/2011  FINDINGS: Minimal bibasilar atelectasis is noted.  Mild soft tissue inflammation is noted about the entirety of the pancreas, concerning for acute pancreatitis. No significant free fluid is seen. There is no evidence of devascularization or pseudocyst formation at this time.  A 9 mm hypodensity is noted at the right hepatic lobe, only minimally changed in appearance from 2007 and likely benign. The liver and spleen are otherwise unremarkable. The patient is status post cholecystectomy, with clips noted at the  gallbladder fossa. The adrenal glands are unremarkable.  The kidneys are unremarkable in appearance. There is no evidence of hydronephrosis. No renal or ureteral stones are seen. No perinephric stranding is appreciated.  The small bowel is unremarkable in appearance. The stomach is within normal limits. No acute vascular abnormalities are seen.  The appendix is not definitely characterized; there is no evidence of appendicitis. The colon is largely decompressed and unremarkable in appearance.  The bladder is mildly distended and grossly unremarkable. A 1.8 cm cyst is noted at the remaining vaginal cuff, likely physiologic in nature. The patient is status post hysterectomy. No suspicious adnexal masses are seen. No inguinal lymphadenopathy is seen.  No acute osseous abnormalities are identified.  IMPRESSION: 1. Acute pancreatitis noted, with mild soft tissue inflammation about the entirety of the pancreas. No evidence of devascularization or pseudocyst formation at this time. 2. 9 mm hypodensity at the right hepatic lobe is only minimally changed from 2007 and is likely benign. 3. 1.8 cm cyst at the vaginal cuff is likely physiologic in nature.   Electronically Signed   By: Roanna Raider M.D.   On: 10/12/2014 00:15   Mr 3d Recon At Scanner  10/17/2014   CLINICAL DATA:  Recurrent acute pancreatitis. Severe abdominal and epigastric pain.  Nausea. Elevated liver function tests. Biliary dilatation seen on recent ultrasound. Prior cholecystectomy.  EXAM: MRI ABDOMEN WITHOUT AND WITH CONTRAST (INCLUDING MRCP)  TECHNIQUE: Multiplanar multisequence MR imaging of the abdomen was performed both before and after the administration of intravenous contrast. Heavily T2-weighted images of the biliary and pancreatic ducts were obtained, and three-dimensional MRCP images were rendered by post processing.  CONTRAST:  20mL MULTIHANCE GADOBENATE DIMEGLUMINE 529 MG/ML IV SOLN  COMPARISON:  CT on 10/11/2014  FINDINGS: Lower chest: No  acute findings.  Hepatobiliary: Mild diffuse hepatic steatosis demonstrated on chemical shift imaging. A tiny sub-cm cyst is seen in segment 8 of the right hepatic lobe, however no liver masses are identified.  Prior cholecystectomy noted. Mild biliary ductal dilatation is seen with common bile duct measuring 12 mm. No evidence of choledocholithiasis. Smooth tapering of the intrapancreatic portion of the distal common bile duct seen, over length of approximately 15 mm on image 43/series 8, suspicious for a distal common bile duct stricture related to pancreatitis.  Pancreas: Diffuse pancreatic swelling and mild peripancreatic inflammatory changes are seen, consistent with acute pancreatitis. No evidence of pancreatic mass or pancreatic ductal dilatation. No evidence of pancreatic necrosis or peripancreatic fluid collections.  Spleen:  Within normal limits in size and appearance.  Adrenal Glands:  No masses identified.  Kidneys: No renal masses identified. No evidence of hydronephrosis.  Stomach/Bowel/Peritoneum: No evidence of wall thickening, mass, or obstruction involving visualized abdominal bowel.  Vascular/Lymphatic: Mild lymphadenopathy noted in the porta hepatis with largest lymph node measuring 2.0 cm on image 19/series 14. This is likely reactive in etiology given findings of acute pancreatitis. No other significant abnormality noted.  Other:  None.  Musculoskeletal:  No suspicious bone lesions identified.  IMPRESSION: Mild to moderate acute pancreatitis. No evidence of pancreatic necrosis, pseudocyst, or other pancreatic complication. No evidence of pancreatic ductal dilatation.  Prior cholecystectomy. Mild diffuse biliary ductal dilatation, with common bile duct measuring 12 mm. No evidence of choledocholithiasis, however there is smooth narrowing of the intrapancreatic portion of the distal common bile duct measuring approximately 15 mm in length, suspicious for distal common bile duct stricture  secondary to pancreatitis.  Mild diffuse hepatic steatosis.  No evidence of hepatic neoplasm.  Mild porta hepatis lymphadenopathy, likely reactive in etiology given findings of acute pancreatitis.   Electronically Signed   By: Myles Rosenthal M.D.   On: 10/17/2014 08:59   Dg Abd Acute W/chest  10/11/2014   CLINICAL DATA:  Epigastric pain continuing into the chest, intermittent over the past year but today has been constant.  EXAM: DG ABDOMEN ACUTE W/ 1V CHEST  COMPARISON:  None.  FINDINGS: There is no evidence of dilated bowel loops or free intraperitoneal air. No radiopaque calculi or other significant radiographic abnormality is seen. Heart size and mediastinal contours are within normal limits. Both lungs are clear.  IMPRESSION: Negative abdominal radiographs.  No acute cardiopulmonary disease.   Electronically Signed   By: Ellery Plunk M.D.   On: 10/11/2014 21:39   Mr Abd W/wo Cm/mrcp  10/17/2014   CLINICAL DATA:  Recurrent acute pancreatitis. Severe abdominal and epigastric pain. Nausea. Elevated liver function tests. Biliary dilatation seen on recent ultrasound. Prior cholecystectomy.  EXAM: MRI ABDOMEN WITHOUT AND WITH CONTRAST (INCLUDING MRCP)  TECHNIQUE: Multiplanar multisequence MR imaging of the abdomen was performed both before and after the administration of intravenous contrast. Heavily T2-weighted images of the biliary and pancreatic ducts were obtained, and three-dimensional MRCP images were rendered by post processing.  CONTRAST:  81mL MULTIHANCE GADOBENATE DIMEGLUMINE 529 MG/ML IV SOLN  COMPARISON:  CT on 10/11/2014  FINDINGS: Lower chest: No acute findings.  Hepatobiliary: Mild diffuse hepatic steatosis demonstrated on chemical shift imaging. A tiny sub-cm cyst is seen in segment 8 of the right hepatic lobe, however no liver masses are identified.  Prior cholecystectomy noted. Mild biliary ductal dilatation is seen with common bile duct measuring 12 mm. No evidence of choledocholithiasis.  Smooth tapering of the intrapancreatic portion of the distal common bile duct seen, over length of approximately 15 mm on image 43/series 8, suspicious for a distal common bile duct stricture related to pancreatitis.  Pancreas: Diffuse pancreatic swelling and mild peripancreatic inflammatory changes are seen, consistent with acute pancreatitis. No evidence of pancreatic mass or pancreatic ductal dilatation. No evidence of pancreatic necrosis or peripancreatic fluid collections.  Spleen:  Within normal limits in size and appearance.  Adrenal Glands:  No masses identified.  Kidneys: No renal masses identified. No evidence of hydronephrosis.  Stomach/Bowel/Peritoneum: No evidence of wall thickening, mass, or obstruction involving visualized abdominal bowel.  Vascular/Lymphatic: Mild lymphadenopathy noted in the porta hepatis with largest lymph node measuring 2.0 cm on image 19/series 14. This is likely reactive in etiology given findings of acute pancreatitis. No other significant abnormality noted.  Other:  None.  Musculoskeletal:  No suspicious bone lesions identified.  IMPRESSION: Mild to moderate acute pancreatitis. No evidence of pancreatic necrosis, pseudocyst, or other pancreatic complication. No evidence of pancreatic ductal dilatation.  Prior cholecystectomy. Mild diffuse biliary ductal dilatation, with common bile duct measuring 12 mm. 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  Mild diffuse hepatic steatosis.  No evidence of hepatic neoplasm.  Mild porta hepatis lymphadenopathy, likely reactive in etiology given findings of acute pancreatitis.   Electronically Signed   By: Myles Rosenthal M.D.   On: 10/17/2014 08:59    Microbiology: No results found for this or any previous visit (from the past 240  hour(s)).   Labs: Basic Metabolic Panel:  Recent Labs Lab 10/16/14 1722 10/17/14 0102 10/18/14 0516 10/19/14 0637 10/20/14 0538  NA 137 135 136 135 138  K 4.2 4.5 4.4 3.8 3.6  CL 101 99* 102 98* 103  CO2 26 27 26 27 28   GLUCOSE 121* 126* 89 84 106*  BUN 6 6 12 9  <5*  CREATININE 0.85 0.82 0.85 0.67 0.62  CALCIUM 9.4 8.9 9.0 8.6* 8.8*   Liver Function Tests:  Recent Labs Lab 10/16/14 1722 10/17/14 0102 10/18/14 0516 10/19/14 0637 10/20/14 0538  AST 246* 257* 108* 37 21  ALT 213* 235* 195* 115* 81*  ALKPHOS 253* 245* 237* 176* 151*  BILITOT 1.6* 2.4* 0.7 0.8 0.8  PROT 8.3* 7.6 7.8 7.4 7.8  ALBUMIN 3.5 3.2* 3.2* 2.8* 2.7*    Recent Labs Lab 10/16/14 1722 10/18/14 0516  LIPASE 1056* 92*  AMYLASE 280*  --    No results for input(s): AMMONIA in the last 168 hours. CBC:  Recent Labs Lab 10/16/14 1722 10/17/14 0102  WBC 7.4 9.3  NEUTROABS 4.7 6.4  HGB 12.2 11.4*  HCT 38.4 36.0  MCV 83.7 83.9  PLT 329 311   Cardiac Enzymes:  Recent Labs Lab 10/17/14 0102  TROPONINI <0.03   BNP: BNP (last 3 results) No results for input(s): BNP in the last 8760 hours.  ProBNP (  last 3 results) No results for input(s): PROBNP in the last 8760 hours.  CBG:  Recent Labs Lab 10/18/14 2351 10/19/14 1130 10/19/14 1755 10/20/14 0003 10/20/14 0534  GLUCAP 84 65 82 98 93       Signed:  Eleanora Guinyard S  [facility] 10/20/2014, 11:14 AM

## 2014-12-10 ENCOUNTER — Other Ambulatory Visit: Payer: Self-pay | Admitting: Gastroenterology

## 2014-12-24 ENCOUNTER — Encounter (HOSPITAL_COMMUNITY): Payer: Self-pay | Admitting: *Deleted

## 2015-01-02 NOTE — H&P (Signed)
  Hannah Lin HPI: She is here for follow up of her pancreatitis. She was discharged to soon initially with her pancreatitis and then rehospitalized. The patient had lap chole for symptomatic gallstones appproximately 3 years ago. The patient denies having a screening colonoscopy. No reports of hematocheza, melena, or any family history of colon cancer.  Past Medical History  Diagnosis Date  . Polymyalgia rheumatica   . Depressed   . GERD (gastroesophageal reflux disease)   . Headache     Hx. of migraines when working  . Arthritis     shoulders,Back,feet,top of spine    Past Surgical History  Procedure Laterality Date  . Cholecystectomy    . Abdominal hysterectomy      Family History  Problem Relation Age of Onset  . Pancreatitis Neg Hx     Social History:  reports that she has never smoked. She has never used smokeless tobacco. She reports that she does not drink alcohol or use illicit drugs.  Allergies:  Allergies  Allergen Reactions  . Sulfa Antibiotics Hives    Medications: Scheduled: Continuous:  No results found for this or any previous visit (from the past 24 hour(s)).   No results found.  ROS:  As stated above in the HPI otherwise negative.  There were no vitals taken for this visit.    PE: Gen: NAD, Alert and Oriented HEENT:  [place]/AT, EOMI Neck: Supple, no LAD Lungs: CTA Bilaterally CV: RRR without M/G/R ABM: Soft, NTND, +BS Ext: No C/C/E  Assessment/Plan: 1) Pancreatitis - EUS.  Nicolaas Savo D 01/02/2015, 7:37 AM

## 2015-01-03 ENCOUNTER — Encounter (HOSPITAL_COMMUNITY): Payer: Self-pay

## 2015-01-03 ENCOUNTER — Ambulatory Visit (HOSPITAL_COMMUNITY): Payer: BLUE CROSS/BLUE SHIELD | Admitting: Anesthesiology

## 2015-01-03 ENCOUNTER — Encounter (HOSPITAL_COMMUNITY)
Admission: RE | Disposition: A | Discharge status: Home / Self Care | Payer: Self-pay | Source: Ambulatory Visit | Attending: Gastroenterology

## 2015-01-03 ENCOUNTER — Ambulatory Visit (HOSPITAL_COMMUNITY)
Admission: RE | Admit: 2015-01-03 | Discharge: 2015-01-03 | Disposition: A | Discharge status: Home / Self Care | Payer: BLUE CROSS/BLUE SHIELD | Source: Ambulatory Visit | Attending: Gastroenterology | Admitting: Gastroenterology

## 2015-01-03 DIAGNOSIS — K760 Fatty (change of) liver, not elsewhere classified: Secondary | ICD-10-CM | POA: Diagnosis not present

## 2015-01-03 DIAGNOSIS — M353 Polymyalgia rheumatica: Secondary | ICD-10-CM | POA: Diagnosis not present

## 2015-01-03 DIAGNOSIS — K219 Gastro-esophageal reflux disease without esophagitis: Secondary | ICD-10-CM | POA: Insufficient documentation

## 2015-01-03 DIAGNOSIS — Z6841 Body Mass Index (BMI) 40.0 and over, adult: Secondary | ICD-10-CM | POA: Diagnosis not present

## 2015-01-03 DIAGNOSIS — M1990 Unspecified osteoarthritis, unspecified site: Secondary | ICD-10-CM | POA: Diagnosis not present

## 2015-01-03 DIAGNOSIS — K861 Other chronic pancreatitis: Secondary | ICD-10-CM | POA: Insufficient documentation

## 2015-01-03 HISTORY — DX: Headache, unspecified: R51.9

## 2015-01-03 HISTORY — DX: Gastro-esophageal reflux disease without esophagitis: K21.9

## 2015-01-03 HISTORY — DX: Headache: R51

## 2015-01-03 HISTORY — DX: Unspecified osteoarthritis, unspecified site: M19.90

## 2015-01-03 HISTORY — PX: EUS: SHX5427

## 2015-01-03 SURGERY — ULTRASOUND, UPPER GI TRACT, ENDOSCOPIC
Anesthesia: Monitor Anesthesia Care

## 2015-01-03 MED ORDER — PROPOFOL 10 MG/ML IV BOLUS
INTRAVENOUS | Status: AC
  Filled 2015-01-03: qty 20

## 2015-01-03 MED ORDER — PROPOFOL 10 MG/ML IV BOLUS: INTRAVENOUS | Status: DC | PRN

## 2015-01-03 MED ORDER — PROPOFOL 10 MG/ML IV BOLUS
INTRAVENOUS | Status: DC | PRN
  Administered 2015-01-03: 100 mg via INTRAVENOUS
  Administered 2015-01-03 (×2): 50 mg via INTRAVENOUS
  Administered 2015-01-03: 100 mg via INTRAVENOUS

## 2015-01-03 MED ORDER — LABETALOL HCL 5 MG/ML IV SOLN
INTRAVENOUS | Status: AC
  Filled 2015-01-03: qty 4

## 2015-01-03 MED ORDER — LIDOCAINE HCL (CARDIAC) 20 MG/ML IV SOLN
INTRAVENOUS | Status: AC
  Filled 2015-01-03: qty 5

## 2015-01-03 MED ORDER — SODIUM CHLORIDE 0.9 % IV SOLN: INTRAVENOUS | Status: DC

## 2015-01-03 MED ORDER — LACTATED RINGERS IV SOLN
INTRAVENOUS | Status: DC
Start: 2015-01-03 — End: 2015-01-03
  Administered 2015-01-03: 1000 mL via INTRAVENOUS

## 2015-01-03 MED ORDER — MIDAZOLAM HCL 5 MG/5ML IJ SOLN
INTRAMUSCULAR | Status: DC | PRN
  Administered 2015-01-03: 2 mg via INTRAVENOUS

## 2015-01-03 MED ORDER — MIDAZOLAM HCL 5 MG/5ML IJ SOLN: INTRAMUSCULAR | Status: DC | PRN

## 2015-01-03 MED ORDER — MIDAZOLAM HCL 2 MG/2ML IJ SOLN
INTRAMUSCULAR | Status: AC
  Filled 2015-01-03: qty 4

## 2015-01-03 MED ORDER — LABETALOL HCL 5 MG/ML IV SOLN
INTRAVENOUS | Status: DC | PRN
  Administered 2015-01-03: 5 mg via INTRAVENOUS

## 2015-01-03 NOTE — Op Note (Signed)
[facility] [address] [place], [address]   UPPER ENDOSCOPIC ULTRASOUND PROCEDURE REPORT     EXAM DATE: 01/03/2015  PATIENT NAME:          Hannah Lin, Hannah Lin          MR#:        [id number]  BIRTHDATE:       29-Oct-1965     VISIT #:     [phone] ATTENDING:     Jeani Hawking, MD     STATUS:     outpatient ASSISTANT:      Arlee Muslim, Zoe Lan and Cyndi Bender MD: ASA CLASS:        Class III  INDICATIONS:  The patient is a 49 yr old female here for a lower endoscopic ultrasound due to established chronic pancreatitis.  PROCEDURE PERFORMED:     Upper Endoscopic Ultrasound without FNA   MEDICATIONS:     Monitored anesthesia care  CONSENT: The patient understands the risks and benefits of the procedure and understands that these risks include, but are not limited to: sedation, allergic reaction, infection, perforation and/or bleeding. Alternative means of evaluation and treatment include, among others: physical exam, x-rays, and/or surgical intervention. The patient elects to proceed with this endoscopic procedure.  DESCRIPTION OF PROCEDURE: During intra-op preparation period all mechanical & medical equipment was checked for proper function. Hand hygiene and appropriate measures for infection prevention was taken. After the risks, benefits and alternatives of the procedure were thoroughly explained, Informed consent was verified, confirmed and timeout was successfully executed by the treatment team. The patient was then placed in the left, lateral, decubitus position and IV sedation was administered. Throughout the procedure, the patients blood pressure, pulse and oxygen saturations were monitored continuously. Under direct visualization, the    was introduced through the mouth and advanced to the second portion of the duodenum.  Water was used as necessary to provide an acoustic interface. The pulse, BP, and O2 saturation were  monitored and documented by the physician and nursing staff throughout the procedure. Upon completion of the imaging, water was removed and the patient was then discharged to recovery in stable condition with the appropriate post procedure care. Estimated blood loss is zero unless otherwise noted in this procedure report.   FINDINGS: The pancreas was identified and there was no evidence of any masses or cystic lesions.  The PD was not visible.  The CBD was identified and it measured 6.5 mm.  She is s/p cholecystectomy.  No evidence of any retained stones or sludge.  The liver did exhibit fatty infiltration.    ADVERSE EVENTS:     There were no complications IMPRESSIONS:     1) Normal pancreas. 2) CBD measured at 6.5 mm. 3) S/p cholecytectomy. 4) Fatty liver.  RECOMMENDATIONS:     1) Follow up as needed.  ___________________________________ Jeani Hawking, MD eSigned:  Jeani Hawking, MD 01/03/2015 11:43 AM   cc:

## 2015-01-03 NOTE — Anesthesia Preprocedure Evaluation (Signed)
Anesthesia Evaluation  Patient identified by MRN, date of birth, ID band Patient awake    Reviewed: Allergy & Precautions, NPO status , Patient's Chart, lab work & pertinent test results  Airway Mallampati: II  TM Distance: >3 FB Neck ROM: Full    Dental no notable dental hx.    Pulmonary neg pulmonary ROS,  breath sounds clear to auscultation  Pulmonary exam normal       Cardiovascular negative cardio ROS Normal cardiovascular examRhythm:Regular Rate:Normal     Neuro/Psych Depression negative neurological ROS     GI/Hepatic Neg liver ROS, GERD-  Medicated and Controlled,  Endo/Other  Morbid obesity  Renal/GU negative Renal ROS  negative genitourinary   Musculoskeletal Polymyalgia Rheumatica   Abdominal   Peds negative pediatric ROS (+)  Hematology negative hematology ROS (+)   Anesthesia Other Findings   Reproductive/Obstetrics negative OB ROS                             Anesthesia Physical Anesthesia Plan  ASA: III  Anesthesia Plan: MAC   Post-op Pain Management:    Induction:   Airway Management Planned:   Additional Equipment:   Intra-op Plan:   Post-operative Plan:   Informed Consent: I have reviewed the patients History and Physical, chart, labs and discussed the procedure including the risks, benefits and alternatives for the proposed anesthesia with the patient or authorized representative who has indicated his/her understanding and acceptance.   Dental advisory given  Plan Discussed with: CRNA  Anesthesia Plan Comments:         Anesthesia Quick Evaluation

## 2015-01-03 NOTE — Anesthesia Postprocedure Evaluation (Signed)
  Anesthesia Post-op Note  Patient: Hannah Lin  Procedure(s) Performed: Procedure(s) (LRB): FULL UPPER ENDOSCOPIC ULTRASOUND (EUS) RADIAL (N/A)  Patient Location: PACU  Anesthesia Type: MAC  Level of Consciousness: awake and alert   Airway and Oxygen Therapy: Patient Spontanous Breathing  Post-op Pain: mild  Post-op Assessment: Post-op Vital signs reviewed, Patient's Cardiovascular Status Stable, Respiratory Function Stable, Patent Airway and No signs of Nausea or vomiting  Last Vitals:  Filed Vitals:   01/03/15 1200  BP: 140/79  Pulse:   Temp:   Resp:     Post-op Vital Signs: stable   Complications: No apparent anesthesia complications

## 2015-01-03 NOTE — Transfer of Care (Signed)
Immediate Anesthesia Transfer of Care Note  Patient: Hannah Lin  Procedure(s) Performed: Procedure(s): FULL UPPER ENDOSCOPIC ULTRASOUND (EUS) RADIAL (N/A)  Patient Location: PACU and Endoscopy Unit  Anesthesia Type:MAC  Level of Consciousness: awake, alert , oriented and patient cooperative  Airway & Oxygen Therapy: Patient Spontanous Breathing and Patient connected to nasal cannula oxygen  Post-op Assessment: Report given to RN and Post -op Vital signs reviewed and stable  Post vital signs: Reviewed and stable  Last Vitals:  Filed Vitals:   01/03/15 0943  BP: 168/95  Pulse: 84  Temp: 36.9 C  Resp: 13    Complications: No apparent anesthesia complications

## 2015-01-03 NOTE — Discharge Instructions (Signed)
Esophagogastroduodenoscopy °Care After °Refer to this sheet in the next few weeks. These instructions provide you with information on caring for yourself after your procedure. Your caregiver may also give you more specific instructions. Your treatment has been planned according to current medical practices, but problems sometimes occur. Call your caregiver if you have any problems or questions after your procedure.  °HOME CARE INSTRUCTIONS °· Do not eat or drink anything until the numbing medicine (local anesthetic) has worn off and your gag reflex has returned. You will know that the local anesthetic has worn off when you can swallow comfortably. °· Do not drive for 12 hours after the procedure or as directed by your caregiver. °· Only take medicines as directed by your caregiver. °SEEK MEDICAL CARE IF:  °· You cannot stop coughing. °· You are not urinating at all or less than usual. °SEEK IMMEDIATE MEDICAL CARE IF: °· You have difficulty swallowing. °· You cannot eat or drink. °· You have worsening throat or chest pain. °· You have dizziness, lightheadedness, or you faint. °· You have nausea or vomiting. °· You have chills. °· You have a fever. °· You have severe abdominal pain. °· You have black, tarry, or bloody stools. °Document Released: 04/05/2012 Document Reviewed: 04/05/2012 °[employer]® Patient Information ©2015 [employer]. This information is not intended to replace advice given to you by your health care provider. Make sure you discuss any questions you have with your health care provider. ° °

## 2015-01-05 ENCOUNTER — Encounter (HOSPITAL_COMMUNITY): Payer: Self-pay | Admitting: Gastroenterology

## 2015-02-04 ENCOUNTER — Other Ambulatory Visit: Payer: Self-pay

## 2015-02-04 DIAGNOSIS — Z1231 Encounter for screening mammogram for malignant neoplasm of breast: Secondary | ICD-10-CM

## 2015-02-24 ENCOUNTER — Ambulatory Visit: Payer: BLUE CROSS/BLUE SHIELD

## 2015-03-17 ENCOUNTER — Ambulatory Visit
Admission: RE | Admit: 2015-03-17 | Discharge: 2015-03-17 | Disposition: A | Discharge status: Home / Self Care | Payer: BLUE CROSS/BLUE SHIELD | Source: Ambulatory Visit

## 2015-03-17 DIAGNOSIS — Z1231 Encounter for screening mammogram for malignant neoplasm of breast: Secondary | ICD-10-CM

## 2016-02-09 ENCOUNTER — Emergency Department (HOSPITAL_COMMUNITY)
Admission: EM | Admit: 2016-02-09 | Discharge: 2016-02-09 | Disposition: A | Discharge status: Home / Self Care | Payer: Medicare Other | Attending: Emergency Medicine | Admitting: Emergency Medicine

## 2016-02-09 ENCOUNTER — Emergency Department (HOSPITAL_COMMUNITY): Payer: Medicare Other

## 2016-02-09 ENCOUNTER — Encounter (HOSPITAL_COMMUNITY): Payer: Self-pay | Admitting: Emergency Medicine

## 2016-02-09 DIAGNOSIS — R079 Chest pain, unspecified: Secondary | ICD-10-CM

## 2016-02-09 DIAGNOSIS — R072 Precordial pain: Secondary | ICD-10-CM | POA: Insufficient documentation

## 2016-02-09 LAB — BASIC METABOLIC PANEL
Anion gap: 11 (ref 5–15)
BUN: 8 mg/dL (ref 6–20)
CALCIUM: 9.3 mg/dL (ref 8.9–10.3)
CO2: 23 mmol/L (ref 22–32)
CREATININE: 0.71 mg/dL (ref 0.44–1.00)
Chloride: 103 mmol/L (ref 101–111)
GFR calc non Af Amer: >60 mL/min (ref >60)
Glucose, Bld: 93 mg/dL (ref 65–99)
Potassium: 4 mmol/L (ref 3.5–5.1)
SODIUM: 137 mmol/L (ref 135–145)

## 2016-02-09 LAB — CBC
HCT: 41.7 % (ref 36.0–46.0)
Hemoglobin: 13 g/dL (ref 12.0–15.0)
MCH: 26.9 pg (ref 26.0–34.0)
MCHC: 31.2 g/dL (ref 30.0–36.0)
MCV: 86.2 fL (ref 78.0–100.0)
PLATELETS: 256 10*3/uL (ref 150–400)
RBC: 4.84 MIL/uL (ref 3.87–5.11)
RDW: 14 % (ref 11.5–15.5)
WBC: 10.2 10*3/uL (ref 4.0–10.5)

## 2016-02-09 LAB — I-STAT TROPONIN, ED
TROPONIN I, POC: 0 ng/mL (ref 0.00–0.08)
Troponin i, poc: 0 ng/mL (ref 0.00–0.08)

## 2016-02-09 NOTE — ED Notes (Signed)
Pt departed in NAD, refused use of wheelchair.  

## 2016-02-09 NOTE — ED Triage Notes (Signed)
Onset chest pain 3 days ago left side chest pain radiating to left shoulder and left arm. Pain constant and continued today. States has chronic back pain and has an appointment with neurology this week. States history of depression and is nervous.

## 2016-02-09 NOTE — ED Provider Notes (Signed)
[facility]-EMERGENCY DEPT Provider Note   CSN: [id number] Arrival date & time: 02/09/16  1656     History   Chief Complaint Chief Complaint  Patient presents with  . Chest Pain    HPI Hannah Lin is a 50 y.o. female.  The history is provided by the patient.  Chest Pain   This is a new problem. The current episode started more than 2 days ago. The problem occurs constantly. The problem has been resolved. The pain is associated with an emotional upset. The pain is present in the substernal region. The pain is mild. The quality of the pain is described as dull. The pain radiates to the left shoulder. Associated symptoms include lower extremity edema. Pertinent negatives include no abdominal pain, no back pain, no claudication, no cough, no diaphoresis, no fever, no leg pain, no palpitations, no shortness of breath, no vomiting and no weakness. She has tried nothing for the symptoms. Risk factors include obesity.  Pertinent negatives for past medical history include no seizures.  Her family medical history is significant for early MI.    Past Medical History:  Diagnosis Date  . Arthritis    shoulders,Back,feet,top of spine  . Depressed   . GERD (gastroesophageal reflux disease)   . Headache    Hx. of migraines when working  . Polymyalgia rheumatica [facility])     Patient Active Problem List   Diagnosis Date Noted  . Acute pancreatitis 10/16/2014  . Elevated LFTs 10/16/2014  . Pancreatitis 10/12/2014  . Pain   . Polymyalgia rheumatica (HCC)   . Depressed     Past Surgical History:  Procedure Laterality Date  . ABDOMINAL HYSTERECTOMY    . CHOLECYSTECTOMY    . EUS N/A 01/03/2015   Procedure: FULL UPPER ENDOSCOPIC ULTRASOUND (EUS) RADIAL;  Surgeon: Jeani Hawking, MD;  Location: [facility] ENDOSCOPY;  Service: Endoscopy;  Laterality: N/A;    OB History    No data available       Home Medications    Prior to Admission medications   Medication Sig Start Date End Date Taking?  Authorizing Provider  amphetamine-dextroamphetamine (ADDERALL) 30 MG tablet Take 30 mg by mouth 2 (two) times daily.    Historical Provider, MD  Biotin (BIOTIN 5000) 5 MG CAPS Take 5 mg by mouth every morning.    Historical Provider, MD  buPROPion (WELLBUTRIN XL) 150 MG 24 hr tablet Take 150 mg by mouth every evening.    Historical Provider, MD  citalopram (CELEXA) 40 MG tablet Take 40 mg by mouth every evening.    Historical Provider, MD  clonazePAM (KLONOPIN) 0.5 MG tablet Take 0.5 mg by mouth 3 (three) times daily as needed for anxiety.    Historical Provider, MD  omeprazole (PRILOSEC) 20 MG capsule Take 20 mg by mouth every morning.    Historical Provider, MD  oxyCODONE-acetaminophen (ROXICET) 5-325 MG per tablet Take 1 tablet by mouth every 4 (four) hours as needed for severe pain. Patient not taking: Reported on 12/12/2014 10/20/14   Meredeth Ide, MD    Family History Family History  Problem Relation Age of Onset  . Pancreatitis Neg Hx     Social History Social History  Substance Use Topics  . Smoking status: Never Smoker  . Smokeless tobacco: Never Used  . Alcohol use No     Allergies   Sulfa antibiotics   Review of Systems Review of Systems  Constitutional: Negative for chills, diaphoresis and fever.  HENT: Negative for ear pain and sore  throat.   Eyes: Negative for pain and visual disturbance.  Respiratory: Negative for cough and shortness of breath.   Cardiovascular: Positive for chest pain and leg swelling. Negative for palpitations and claudication.  Gastrointestinal: Negative for abdominal pain and vomiting.  Genitourinary: Negative for dysuria and hematuria.  Musculoskeletal: Negative for arthralgias and back pain.  Skin: Negative for color change and rash.  Neurological: Negative for seizures, syncope and weakness.  All other systems reviewed and are negative.    Physical Exam Updated Vital Signs BP 123/75   Pulse 80   Temp 98.2 F (36.8 C) (Oral)    Resp 19   Ht 5\' 7"  (1.702 m)   Wt 136.1 kg   SpO2 99%   BMI 46.99 kg/m   Physical Exam  Constitutional: She appears well-developed and well-nourished. No distress.  HENT:  Head: Normocephalic and atraumatic.  Eyes: Conjunctivae and EOM are normal. Pupils are equal, round, and reactive to light.  Neck: Normal range of motion. Neck supple.  Cardiovascular: Normal rate and regular rhythm.   Pulmonary/Chest: Effort normal and breath sounds normal. No respiratory distress.  Abdominal: Soft. There is no tenderness.  Musculoskeletal: She exhibits no edema.  Neurological: She is alert.  Skin: Skin is warm and dry.  Psychiatric: She has a normal mood and affect.  Nursing note and vitals reviewed.    ED Treatments / Results  Labs (all labs ordered are listed, but only abnormal results are displayed) Labs Reviewed  BASIC METABOLIC PANEL  CBC  I-STAT TROPOININ, ED  I-STAT TROPOININ, ED    EKG  EKG Interpretation  Date/Time:  Monday February 09 2016 17:02:40 EDT Ventricular Rate:  90 PR Interval:  158 QRS Duration: 78 QT Interval:  342 QTC Calculation: 418 R Axis:   83 Text Interpretation:  Normal sinus rhythm Normal ECG No significant change since last tracing Confirmed by [facility] Ill[facility] Healthcare System - DanvilleCHLOSSMAN MD, ERIN ([id number]) on 02/09/2016 9:40:18 PM       Radiology Dg Chest 2 View  Result Date: 02/09/2016 CLINICAL DATA:  Left-side chest pain for 3 days.  No known injury. EXAM: CHEST  2 VIEW COMPARISON:  Single-view of the chest 10/11/2014. FINDINGS: There is cardiomegaly without edema. No pneumothorax or pleural effusion. Lungs are clear. No bony abnormality. IMPRESSION: Cardiomegaly without acute disease. Electronically Signed   By: Drusilla Kannerhomas  Dalessio M.D.   On: 02/09/2016 18:15    Procedures Procedures (including critical care time)  Medications Ordered in ED Medications - No data to display   Initial Impression / Assessment and Plan / ED Course  I have reviewed the triage vital signs and the  nursing notes.  Pertinent labs & imaging results that were available during my care of the patient were reviewed by me and considered in my medical decision making (see chart for details).  Clinical Course    Patient presents for chest pain.  EKG shows NSR without concerning features.  HEART score of 3.  Negative delta troponins. Unremarkable CBC and BMP.  Chest Xr, personally reviewed by me, demonstrates no acute cardiac or pulmonary findings.  Patient is discharged with strict return precautions, follow up instructions, and educational materials.  Final Clinical Impressions(s) / ED Diagnoses   Final diagnoses:  Nonspecific chest pain    New Prescriptions New Prescriptions   No medications on file     Garey HamMichael E Zoriah Pulice, MD 02/10/16 [id number]    Alvira MondayErin Schlossman, MD 02/18/16 [phone]

## 2016-02-09 NOTE — ED Notes (Signed)
Pt getting dressed ready for discharge.

## 2016-02-19 ENCOUNTER — Other Ambulatory Visit: Payer: Self-pay | Admitting: Family Medicine

## 2016-02-19 DIAGNOSIS — N644 Mastodynia: Secondary | ICD-10-CM

## 2016-02-25 ENCOUNTER — Other Ambulatory Visit: Payer: Medicare Other

## 2016-02-27 ENCOUNTER — Other Ambulatory Visit: Payer: Self-pay | Admitting: Specialist

## 2016-02-27 DIAGNOSIS — M542 Cervicalgia: Secondary | ICD-10-CM

## 2016-02-27 DIAGNOSIS — G3184 Mild cognitive impairment, so stated: Secondary | ICD-10-CM

## 2016-03-12 ENCOUNTER — Other Ambulatory Visit: Payer: Medicare Other

## 2016-03-12 ENCOUNTER — Ambulatory Visit
Admission: RE | Admit: 2016-03-12 | Discharge: 2016-03-12 | Disposition: A | Discharge status: Home / Self Care | Payer: Medicare Other | Source: Ambulatory Visit | Attending: Specialist | Admitting: Specialist

## 2016-03-12 DIAGNOSIS — G3184 Mild cognitive impairment, so stated: Secondary | ICD-10-CM

## 2016-03-12 DIAGNOSIS — M542 Cervicalgia: Secondary | ICD-10-CM

## 2016-06-28 ENCOUNTER — Other Ambulatory Visit: Payer: Medicare Other

## 2016-07-05 ENCOUNTER — Ambulatory Visit
Admission: RE | Admit: 2016-07-05 | Discharge: 2016-07-05 | Disposition: A | Discharge status: Home / Self Care | Payer: Medicare PPO | Source: Ambulatory Visit | Attending: Family Medicine | Admitting: Family Medicine

## 2016-07-05 DIAGNOSIS — N644 Mastodynia: Secondary | ICD-10-CM

## 2017-11-08 IMAGING — MG MM SCREEN MAMMOGRAM BILATERAL
6 series · 6 of 6 positions shown · non-contrast
Comparison: Previous exam(s).

CLINICAL DATA: Screening.

EXAM:
DIGITAL SCREENING BILATERAL MAMMOGRAM WITH CAD

[R CC (1 of 2)]
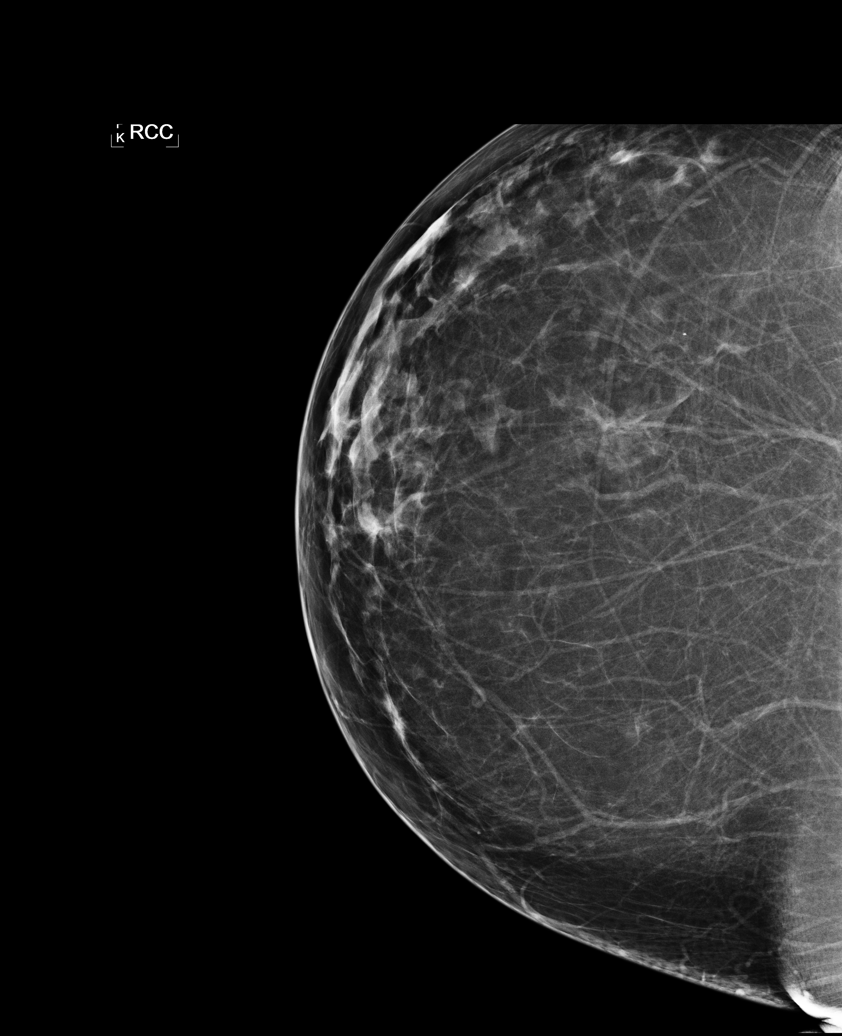

[L CC (1 of 2)]
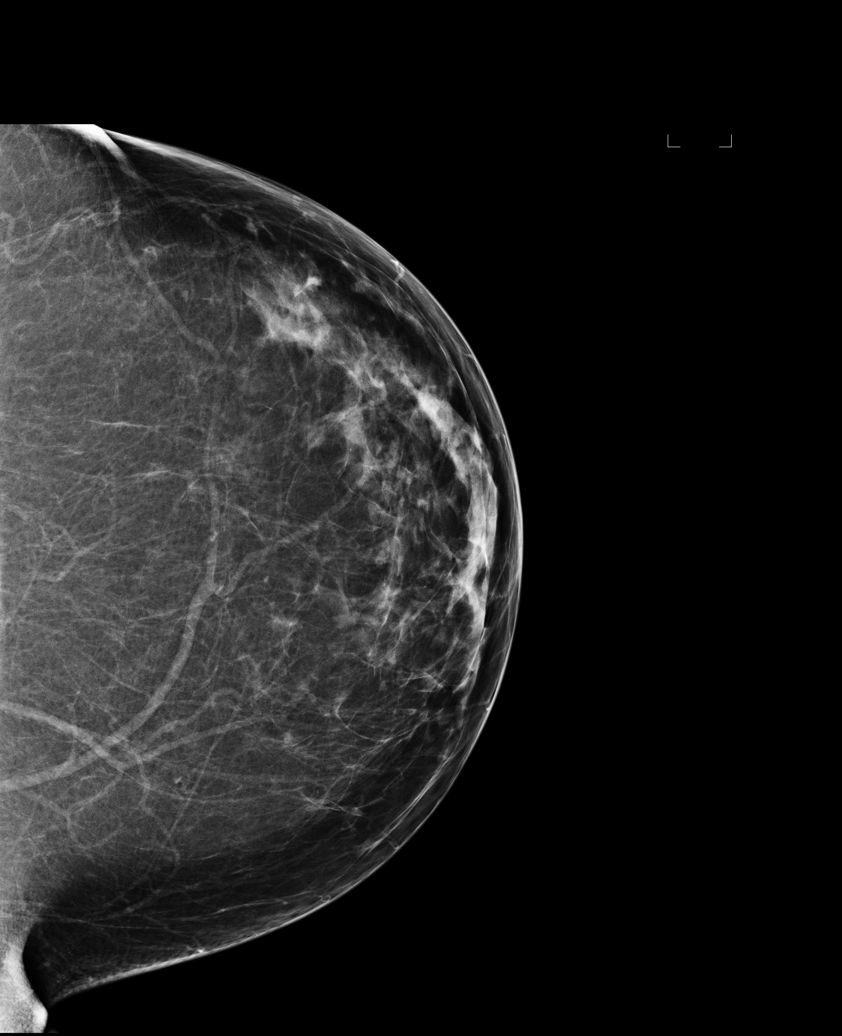

[L MLO]
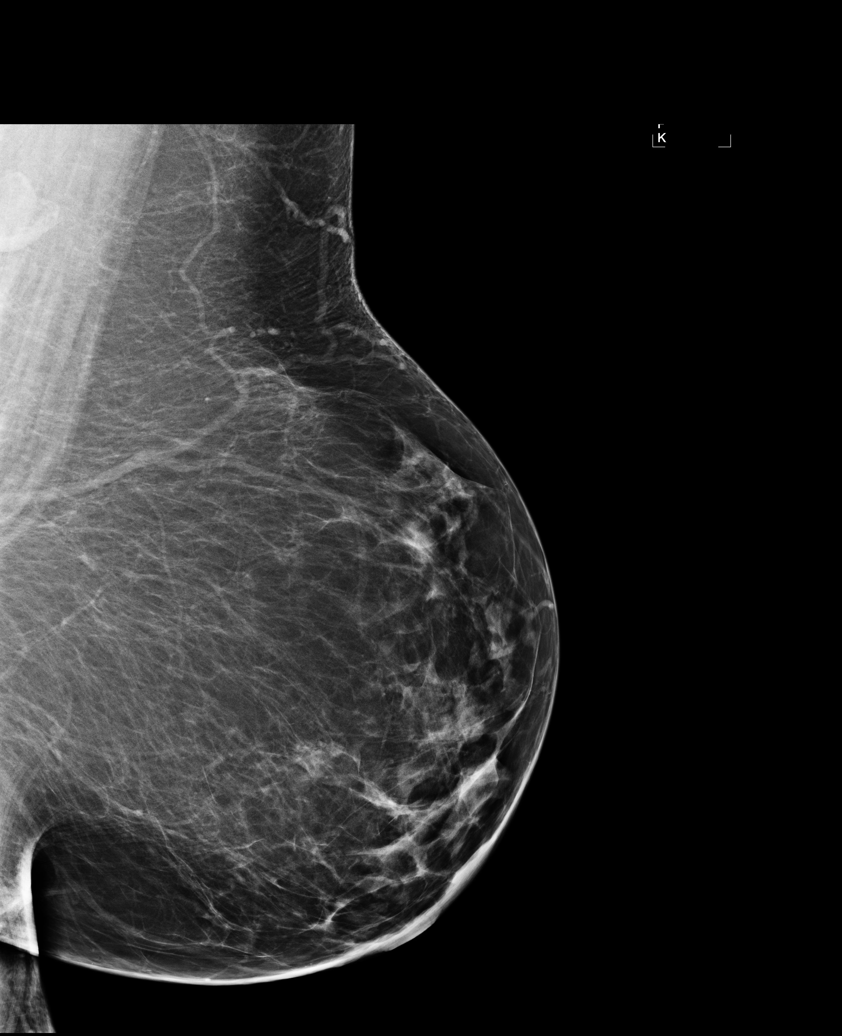

[R MLO]
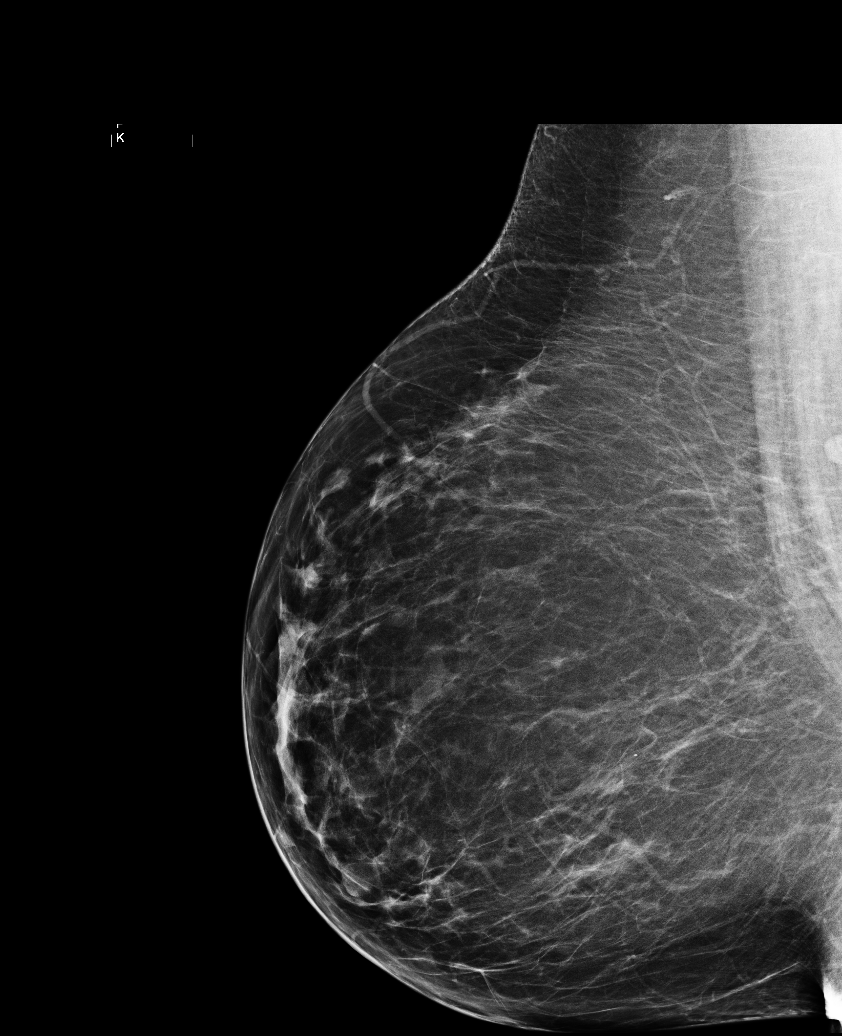

[R CC (2 of 2)]
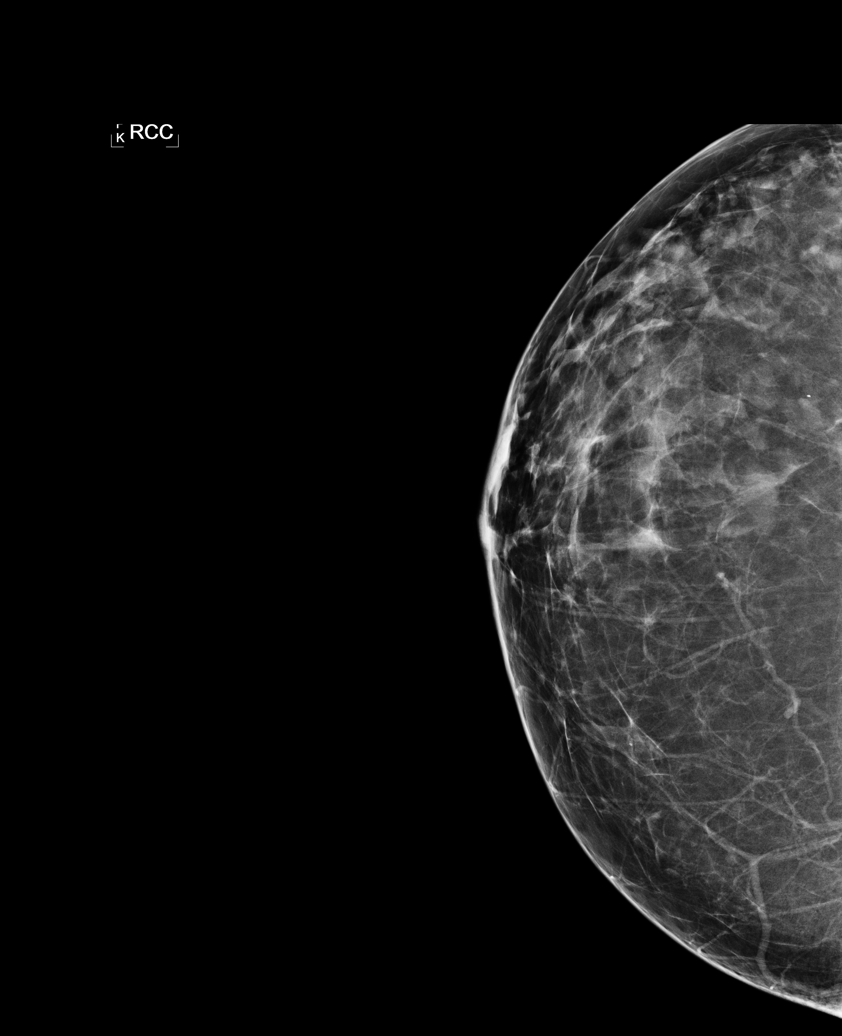

[L CC (2 of 2)]
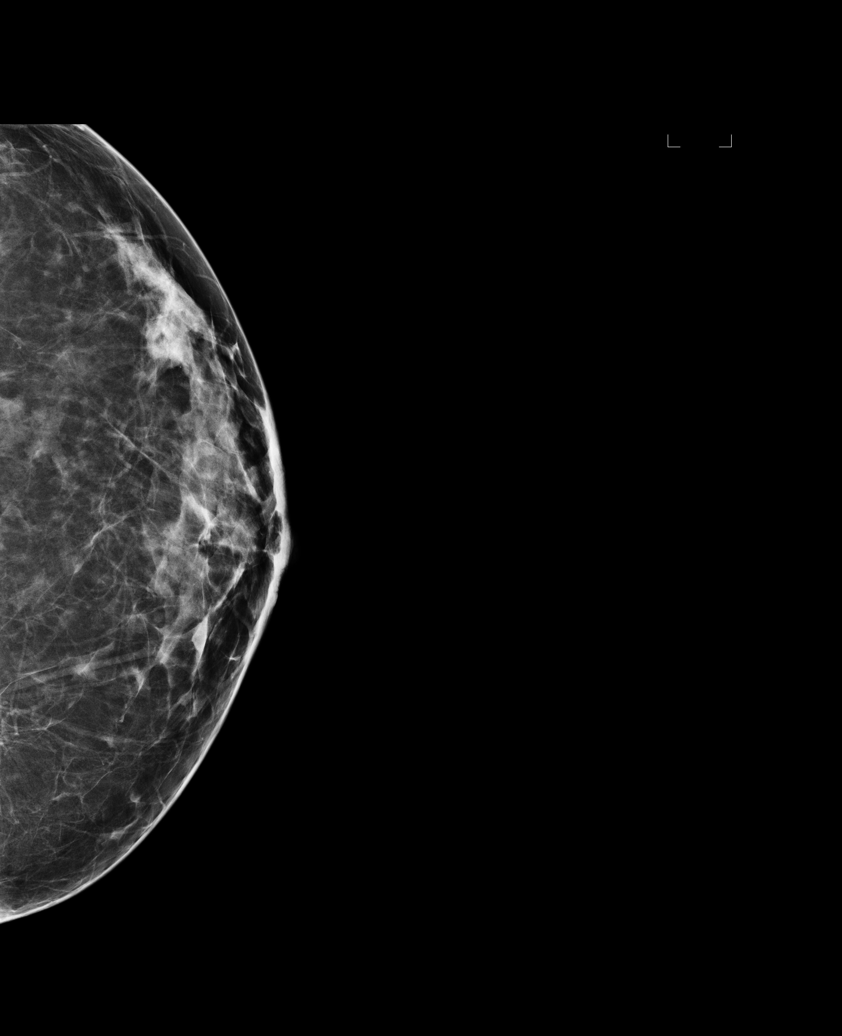

[6 of 6 positions shown; findings below may reference images not displayed]

ACR Breast Density Category b: There are scattered areas of
fibroglandular density.
FINDINGS: There are no findings suspicious for malignancy. Images were
processed with CAD.
IMPRESSION: No mammographic evidence of malignancy. A result letter of this
screening mammogram will be mailed directly to the patient.

RECOMMENDATION:
Screening mammogram in one year. (Code:AS-G-LCT)

BI-RADS CATEGORY  1: Negative.

## 2019-02-14 ENCOUNTER — Other Ambulatory Visit: Payer: Self-pay

## 2019-02-14 DIAGNOSIS — Z20822 Contact with and (suspected) exposure to covid-19: Secondary | ICD-10-CM

## 2019-02-15 LAB — NOVEL CORONAVIRUS, NAA: SARS-CoV-2, NAA: NOT DETECTED

## 2019-03-20 ENCOUNTER — Other Ambulatory Visit: Payer: Self-pay | Admitting: Family Medicine

## 2019-03-20 DIAGNOSIS — Z1231 Encounter for screening mammogram for malignant neoplasm of breast: Secondary | ICD-10-CM

## 2019-05-16 ENCOUNTER — Ambulatory Visit: Payer: Medicare PPO

## 2019-07-31 ENCOUNTER — Ambulatory Visit: Payer: Medicare PPO

## 2020-08-18 ENCOUNTER — Encounter (HOSPITAL_COMMUNITY): Payer: Self-pay | Admitting: *Deleted

## 2020-08-18 ENCOUNTER — Emergency Department (HOSPITAL_COMMUNITY): Payer: Medicare PPO

## 2020-08-18 ENCOUNTER — Other Ambulatory Visit: Payer: Self-pay

## 2020-08-18 ENCOUNTER — Emergency Department (HOSPITAL_COMMUNITY)
Admission: EM | Admit: 2020-08-18 | Discharge: 2020-08-18 | Disposition: A | Discharge status: Home / Self Care | Payer: Medicare PPO | Attending: Emergency Medicine | Admitting: Emergency Medicine

## 2020-08-18 DIAGNOSIS — R109 Unspecified abdominal pain: Secondary | ICD-10-CM | POA: Diagnosis not present

## 2020-08-18 DIAGNOSIS — K7689 Other specified diseases of liver: Secondary | ICD-10-CM

## 2020-08-18 DIAGNOSIS — N83202 Unspecified ovarian cyst, left side: Secondary | ICD-10-CM | POA: Insufficient documentation

## 2020-08-18 DIAGNOSIS — K760 Fatty (change of) liver, not elsewhere classified: Secondary | ICD-10-CM

## 2020-08-18 LAB — CBC WITH DIFFERENTIAL/PLATELET
Abs Immature Granulocytes: 0.03 10*3/uL (ref 0.00–0.07)
Basophils Absolute: 0.1 10*3/uL (ref 0.0–0.1)
Basophils Relative: 1 %
Eosinophils Absolute: 0.1 10*3/uL (ref 0.0–0.5)
Eosinophils Relative: 1 %
HCT: 40.1 % (ref 36.0–46.0)
Hemoglobin: 12.4 g/dL (ref 12.0–15.0)
Immature Granulocytes: 0 %
Lymphocytes Relative: 38 %
Lymphs Abs: 4.1 10*3/uL — ABNORMAL HIGH (ref 0.7–4.0)
MCH: 26.7 pg (ref 26.0–34.0)
MCHC: 30.9 g/dL (ref 30.0–36.0)
MCV: 86.4 fL (ref 80.0–100.0)
Monocytes Absolute: 0.5 10*3/uL (ref 0.1–1.0)
Monocytes Relative: 5 %
Neutro Abs: 5.9 10*3/uL (ref 1.7–7.7)
Neutrophils Relative %: 55 %
Platelets: 273 10*3/uL (ref 150–400)
RBC: 4.64 MIL/uL (ref 3.87–5.11)
RDW: 13.7 % (ref 11.5–15.5)
WBC: 10.7 10*3/uL — ABNORMAL HIGH (ref 4.0–10.5)
nRBC: 0 % (ref 0.0–0.2)

## 2020-08-18 LAB — URINALYSIS, ROUTINE W REFLEX MICROSCOPIC
Bilirubin Urine: NEGATIVE
Glucose, UA: NEGATIVE mg/dL
Hgb urine dipstick: NEGATIVE
Ketones, ur: NEGATIVE mg/dL
Leukocytes,Ua: NEGATIVE
Nitrite: NEGATIVE
Protein, ur: NEGATIVE mg/dL
Specific Gravity, Urine: 1.024 (ref 1.005–1.030)
pH: 5 (ref 5.0–8.0)

## 2020-08-18 LAB — COMPREHENSIVE METABOLIC PANEL
ALT: 18 U/L (ref 0–44)
AST: 16 U/L (ref 15–41)
Albumin: 3.6 g/dL (ref 3.5–5.0)
Alkaline Phosphatase: 77 U/L (ref 38–126)
Anion gap: 12 (ref 5–15)
BUN: 20 mg/dL (ref 6–20)
CO2: 25 mmol/L (ref 22–32)
Calcium: 9.3 mg/dL (ref 8.9–10.3)
Chloride: 101 mmol/L (ref 98–111)
Creatinine, Ser: 0.71 mg/dL (ref 0.44–1.00)
GFR, Estimated: >60 mL/min (ref >60)
Glucose, Bld: 104 mg/dL — ABNORMAL HIGH (ref 70–99)
Potassium: 4 mmol/L (ref 3.5–5.1)
Sodium: 138 mmol/L (ref 135–145)
Total Bilirubin: 0.4 mg/dL (ref 0.3–1.2)
Total Protein: 7.8 g/dL (ref 6.5–8.1)

## 2020-08-18 MED ORDER — CYCLOBENZAPRINE HCL 10 MG PO TABS: 10.0000 mg | ORAL_TABLET | Freq: Once | ORAL | Status: DC

## 2020-08-18 MED ORDER — IOHEXOL 300 MG/ML  SOLN
100.0000 mL | Freq: Once | INTRAMUSCULAR | Status: AC | PRN
  Administered 2020-08-18: 100 mL via INTRAVENOUS

## 2020-08-18 MED ORDER — OXYCODONE-ACETAMINOPHEN 5-325 MG PO TABS: 1.0000 | ORAL_TABLET | Freq: Once | ORAL | Status: DC

## 2020-08-18 NOTE — ED Triage Notes (Signed)
Pain in right flank area onset 4 months ago, getting worse

## 2020-08-18 NOTE — ED Notes (Signed)
Pt states she would like to wait before taking any pain medication at this time

## 2020-08-18 NOTE — Discharge Instructions (Addendum)
You were seen in the emergency department for pain in your right flank for the last several months  Lab work, urinalysis and CT scans were done today  The cause of your pain is still unclear  CT showed some incidental findings including a small liver cyst, a left ovarian cyst and abnormal anterior posterior positioning of the right kidney  For pain and inflammation you can use a combination of ibuprofen and acetaminophen.  Take [id number] mg acetaminophen (tylenol) every 6 hours or 600 mg ibuprofen (advil, motrin) every 6 hours.  You can take these separately or combine them every 6 hours for maximum pain control. Do not exceed 4,000 mg acetaminophen or 2,400 mg ibuprofen in a 24 hour period.  Do not take ibuprofen containing products if you have history of kidney disease, ulcers, GI bleeding, severe acid reflux, or take a blood thinner.  Do not take acetaminophen if you have liver disease.   Follow-up with your primary care doctor in 1 to 2 weeks for further discussion of your pain and findings on CT scan.  He may recommend referrals or more testing  Return to the ED for worsening pain, pain in your chest, shortness of breath, abdominal pain, vomiting, diarrhea, urinary symptoms, rash

## 2020-08-18 NOTE — ED Provider Notes (Signed)
MSE was initiated and I personally evaluated the patient and placed orders (if any) at  2:33 PM on August 18, 2020.  The patient appears stable so that the remainder of the MSE may be completed by another provider.  Patient presents with right-sided abdominal/flank pain ongoing for 4 to 6 months worse in the last few days.  Denies fevers or cough or vomiting or diarrhea.   Cheryll Cockayne, MD 08/18/20 [phone]

## 2020-08-18 NOTE — ED Provider Notes (Signed)
[facility] EMERGENCY DEPARTMENT Provider Note   CSN: [id number] Arrival date & time: 08/18/20  1333     History Chief Complaint  Patient presents with  . Flank Pain    Hannah Lin is a 55 y.o. female with BMI of 53 presents to the ED for evaluation of pain in her right flank for over 6 months.  Described as sharp, achy.  Initially was milder and more intermittent but now reports she is hurting every day all day.  The pain is worse with twisting, bending and especially after sitting down for prolonged periods of time.  This has been worsening over the last several weeks.  Has taken ibuprofen occasionally but reports that she stopped taking it consistently because it was not helping.  She saw a PCP NP 6 months ago for the same and was told it was a pulled muscle.  She was prescribed a muscle relaxer but states it did not help.  She does not think a muscle problem would be going on this long.  She came to the ED today because she feels like this has been going on long enough.  Also states last time she had pain in her abdomen her doctor thought it was just acid reflux and she had pancreatitis and needed to be admitted for it.  She is afraid that something like this is going to happen again so she would rather come to the hospital.  Reports kidney problems in her entire family.  Her sister had a tumor in her kidney.  Her mother had kidney stones.  Her brother has some problems with his kidney as well.  Reports chronic back pain issues from arthritis that have also been worsening.  History of cholecystectomy.  Denies preceding fall, heavy lifting.  Denies fevers, chills.  Denies vomiting.  Reports chronic constipation but takes OTC herbal medicine from [facility] that helps her stay regular.  No hematuria, urinary frequency or dysuria.  No associated pain into his chest, shortness of breath, pleuritic pain.  No abdominal pain.  No extremity paresthesias, numbness or weakness.  No rash. She has not had  shingles.  She has an upcoming planned trip with her sister and wants to make sure everything is okay before she leaves town.    HPI     Past Medical History:  Diagnosis Date  . Arthritis    shoulders,Back,feet,top of spine  . Depressed   . GERD (gastroesophageal reflux disease)   . Headache    Hx. of migraines when working  . Polymyalgia rheumatica [facility])     Patient Active Problem List   Diagnosis Date Noted  . Acute pancreatitis 10/16/2014  . Elevated LFTs 10/16/2014  . Pancreatitis 10/12/2014  . Pain   . Polymyalgia rheumatica (HCC)   . Depressed     Past Surgical History:  Procedure Laterality Date  . ABDOMINAL HYSTERECTOMY    . CHOLECYSTECTOMY    . EUS N/A 01/03/2015   Procedure: FULL UPPER ENDOSCOPIC ULTRASOUND (EUS) RADIAL;  Surgeon: Jeani Hawking, MD;  Location: [facility] ENDOSCOPY;  Service: Endoscopy;  Laterality: N/A;     OB History   No obstetric history on file.     Family History  Problem Relation Age of Onset  . Pancreatitis Neg Hx     Social History   Tobacco Use  . Smoking status: Never Smoker  . Smokeless tobacco: Never Used  Substance Use Topics  . Alcohol use: No  . Drug use: No    Home Medications  Prior to Admission medications   Medication Sig Start Date End Date Taking? Authorizing Provider  amphetamine-dextroamphetamine (ADDERALL) 30 MG tablet Take 30 mg by mouth 2 (two) times daily.    [provider]  Biotin w/ Vitamins C & E (HAIR SKIN & NAILS GUMMIES) 1250-7.5-7.5 MCG-MG-UNT CHEW Chew 2 tablets by mouth every morning.    [provider]  buPROPion (WELLBUTRIN XL) 150 MG 24 hr tablet Take 150 mg by mouth every evening.    [provider]  citalopram (CELEXA) 40 MG tablet Take 40 mg by mouth every evening.    [provider]  clonazePAM (KLONOPIN) 1 MG tablet Take 1 mg by mouth 2 (two) times daily as needed for anxiety.    [provider]  EPINEPHrine 0.3 mg/0.3 mL IJ SOAJ injection Inject 0.3  mg into the muscle once as needed for anaphylaxis.    [provider]  omeprazole (PRILOSEC) 20 MG capsule Take 20 mg by mouth daily as needed (indigestion).     [provider]  oxyCODONE-acetaminophen (ROXICET) 5-325 MG per tablet Take 1 tablet by mouth every 4 (four) hours as needed for severe pain. Patient not taking: Reported on 02/09/2016 10/20/14   Meredeth Ide, MD  triamcinolone cream (KENALOG) 0.1 % Apply 1 application topically See admin instructions. Two to three times a day to affected areas    [provider]  valACYclovir (VALTREX) 1000 MG tablet Take 1,000 mg by mouth every 12 (twelve) hours as needed. For fever blister flares 06/17/15   [provider]    Allergies    Sulfa antibiotics  Review of Systems   Review of Systems  Genitourinary: Positive for flank pain.  All other systems reviewed and are negative.   Physical Exam Updated Vital Signs BP (!) 148/94   Pulse 81   Temp 97.7 F (36.5 C) (Oral)   Resp 18   Ht 5\' 4"  (1.626 m)   Wt (!) 141.2 kg   SpO2 93%   BMI 53.43 kg/m   Physical Exam Vitals and nursing note reviewed.  Constitutional:      Appearance: She is well-developed.     Comments: Non toxic in NAD  HENT:     Head: Normocephalic and atraumatic.     Nose: Nose normal.  Eyes:     Conjunctiva/sclera: Conjunctivae normal.  Cardiovascular:     Rate and Rhythm: Normal rate and regular rhythm.     Pulses:          Radial pulses are 1+ on the right side and 1+ on the left side.       Dorsalis pedis pulses are 1+ on the right side and 1+ on the left side.  Pulmonary:     Effort: Pulmonary effort is normal.     Breath sounds: Normal breath sounds.  Abdominal:     General: Bowel sounds are normal.     Palpations: Abdomen is soft.     Tenderness: There is no abdominal tenderness.     Comments: No G/R/R. No suprapubic or CVA tenderness. Negative Murphy's and McBurney's. Active BS to lower quadrants. Obese.    Musculoskeletal:        General: Normal range of motion.     Cervical back: Normal range of motion.       Back:     Comments: Diffuse right sided paraspinal muscular thoracic tenderness. No midline TL spine tenderness. Pain with sitting up for exam. Skin normal over the back/flank.   Skin:  General: Skin is warm and dry.     Capillary Refill: Capillary refill takes less than 2 seconds.  Neurological:     Mental Status: She is alert.     Comments: Sensation and strength intact in upper/lower extremities   Psychiatric:        Behavior: Behavior normal.     ED Results / Procedures / Treatments   Labs (all labs ordered are listed, but only abnormal results are displayed) Labs Reviewed  CBC WITH DIFFERENTIAL/PLATELET - Abnormal; Notable for the following components:      Result Value   WBC 10.7 (*)    Lymphs Abs 4.1 (*)    All other components within normal limits  COMPREHENSIVE METABOLIC PANEL - Abnormal; Notable for the following components:   Glucose, Bld 104 (*)    All other components within normal limits  URINALYSIS, ROUTINE W REFLEX MICROSCOPIC - Abnormal; Notable for the following components:   APPearance HAZY (*)    All other components within normal limits    EKG None  Radiology CT Abdomen Pelvis W Contrast  Result Date: 08/18/2020 CLINICAL DATA:  RIGHT-sided abdominal pain, RIGHT upper quadrant may for 4 months EXAM: CT ABDOMEN AND PELVIS WITH CONTRAST TECHNIQUE: Multidetector CT imaging of the abdomen and pelvis was performed using the standard protocol following bolus administration of intravenous contrast. Sagittal and coronal MPR images reconstructed from axial data set. CONTRAST:  OMNIPAQUE IOHEXOL 300 MG/ML SOLN IV. No oral contrast. COMPARISON:  10/11/2014 FINDINGS: Lower chest: Minimal atelectasis LEFT lung base. Hepatobiliary: Nonspecific low-attenuation focus RIGHT lobe liver 8 mm diameter unchanged. Diffuse fatty infiltration of liver. Gallbladder  surgically absent. No biliary dilatation. Pancreas: Normal appearance Spleen: Normal appearance Adrenals/Urinary Tract: Abnormal AP axis of RIGHT kidney unchanged. Adrenal glands, kidneys, ureters, and bladder otherwise normal appearance. Stomach/Bowel: Appendix not identified. No pericecal inflammatory process seen. Stomach unremarkable. Bowel loops unremarkable. Vascular/Lymphatic: Scattered pelvic phleboliths. Aorta normal caliber. Vascular structures patent. Reproductive: Uterus surgically absent. 2.3 cm cyst LEFT ovary previously 1.7 cm; this is a generally simple appearing cyst and no follow-up imaging is recommended. Other: No free air or free fluid.  No hernia. Musculoskeletal: No acute osseous findings. IMPRESSION: Fatty infiltration of liver with stable 8 mm low-attenuation focus RIGHT lobe liver. No acute intra-abdominal or intrapelvic abnormalities. Electronically Signed   By: Ulyses Southward M.D.   On: 08/18/2020 17:59    Procedures Procedures   Medications Ordered in ED Medications  oxyCODONE-acetaminophen (PERCOCET/ROXICET) 5-325 MG per tablet 1 tablet (has no administration in time range)  cyclobenzaprine (FLEXERIL) tablet 10 mg (has no administration in time range)  iohexol (OMNIPAQUE) 300 MG/ML solution 100 mL (100 mLs Intravenous Contrast Given 08/18/20 1720)    ED Course  I have reviewed the triage vital signs and the nursing notes.  Pertinent labs & imaging results that were available during my care of the patient were reviewed by me and considered in my medical decision making (see chart for details).    MDM Rules/Calculators/A&P                          55 year old female with BMI of 53 presents to the ED for right-sided flank pain for over 6 months.  EMR, triage or nurse notes reviewed.  Seen by PCP NP 6 months ago for same and diagnosed with musculoskeletal back pain and discharged with NSAIDs, Robaxin which patient reports did not help.  No imaging.  DDx-high index of  suspicion  for MSK etiology including arthritis.  She has reproducible right-sided thoracic muscular tenderness, pain with movement.  No radicular symptoms.  Normal sensation and strength.  Abdominal exam benign.  No distal pulse deficits.  No rash.  No saddle anesthesia, changes in bladder or bowel control, fever, IV drug use, urinary symptoms.  Considered other more insidious etiologies like pyelonephritis, ureteral stone, cauda equina, epidural abscess dissection highly unlikely given the duration of her symptoms, normal vital signs and reassuring exam.  [facility]  Lab work, urinalysis and CT A/P ordered in triage/[facility].  Lab work, imaging personally visualized and interpreted by me.  Labs reveal-vastly reassuring.  No leukocytosis.  Normal creatinine, LFTs.  Urinalysis without infection, RBCs.  Imaging reveals-several incidental findings including stable right lobe liver cyst, left ovarian cyst, fatty liver infiltration of the liver and abnormal AP positioning of the right kidney that is stable.  Medicines ordered in the ED-oxycodone, acetaminophen, Flexeril.  Patient declined medicines here.  Patient reevaluated.  Discussed findings of imaging and labs.  At this time cause of pain is unclear.  MSK is a possibility.?  Abnormal positioning of the right kidney.  No further indication for more emergent work-up, admission.  We will recommend high-dose NSAIDs, lidocaine patch, PCP follow-up.  Return precautions discussed.  Patient is comfortable with this plan. Emergent imaging not thought to be indicated today as physical exam Final Clinical Impression(s) / ED Diagnoses Final diagnoses:  Flank pain  Cyst of left ovary  Liver cyst  Fatty liver    Rx / DC Orders ED Discharge Orders    None       Liberty HandyGibbons, Tahari Clabaugh J, PA-C 08/18/20 1949    Terrilee FilesButler, Michael C, MD 08/19/20 1035

## 2020-08-22 ENCOUNTER — Other Ambulatory Visit: Payer: Self-pay | Admitting: Family Medicine

## 2020-08-22 DIAGNOSIS — Z1231 Encounter for screening mammogram for malignant neoplasm of breast: Secondary | ICD-10-CM

## 2020-10-10 ENCOUNTER — Ambulatory Visit
Admission: RE | Admit: 2020-10-10 | Discharge: 2020-10-10 | Disposition: A | Discharge status: Home / Self Care | Payer: Medicare PPO | Source: Ambulatory Visit | Attending: Family Medicine | Admitting: Family Medicine

## 2020-10-10 ENCOUNTER — Other Ambulatory Visit: Payer: Self-pay

## 2020-10-10 DIAGNOSIS — Z1231 Encounter for screening mammogram for malignant neoplasm of breast: Secondary | ICD-10-CM

## 2021-12-20 ENCOUNTER — Encounter (HOSPITAL_COMMUNITY): Payer: Self-pay

## 2021-12-20 ENCOUNTER — Emergency Department (HOSPITAL_COMMUNITY): Payer: Medicare PPO

## 2021-12-20 ENCOUNTER — Emergency Department (HOSPITAL_COMMUNITY)
Admission: EM | Admit: 2021-12-20 | Discharge: 2021-12-20 | Disposition: A | Discharge status: Home / Self Care | Payer: Medicare PPO | Attending: Emergency Medicine | Admitting: Emergency Medicine

## 2021-12-20 ENCOUNTER — Other Ambulatory Visit: Payer: Self-pay

## 2021-12-20 DIAGNOSIS — R109 Unspecified abdominal pain: Secondary | ICD-10-CM

## 2021-12-20 DIAGNOSIS — R630 Anorexia: Secondary | ICD-10-CM | POA: Insufficient documentation

## 2021-12-20 LAB — COMPREHENSIVE METABOLIC PANEL
ALT: 24 U/L (ref 0–44)
AST: 19 U/L (ref 15–41)
Albumin: 3.6 g/dL (ref 3.5–5.0)
Alkaline Phosphatase: 76 U/L (ref 38–126)
Anion gap: 9 (ref 5–15)
BUN: 16 mg/dL (ref 6–20)
CO2: 24 mmol/L (ref 22–32)
Calcium: 9.1 mg/dL (ref 8.9–10.3)
Chloride: 105 mmol/L (ref 98–111)
Creatinine, Ser: 0.86 mg/dL (ref 0.44–1.00)
GFR, Estimated: >60 mL/min (ref >60)
Glucose, Bld: 104 mg/dL — ABNORMAL HIGH (ref 70–99)
Potassium: 3.7 mmol/L (ref 3.5–5.1)
Sodium: 138 mmol/L (ref 135–145)
Total Bilirubin: 0.3 mg/dL (ref 0.3–1.2)
Total Protein: 7.5 g/dL (ref 6.5–8.1)

## 2021-12-20 LAB — CBC WITH DIFFERENTIAL/PLATELET
Abs Immature Granulocytes: 0.02 10*3/uL (ref 0.00–0.07)
Basophils Absolute: 0.1 10*3/uL (ref 0.0–0.1)
Basophils Relative: 1 %
Eosinophils Absolute: 0.1 10*3/uL (ref 0.0–0.5)
Eosinophils Relative: 1 %
HCT: 38.5 % (ref 36.0–46.0)
Hemoglobin: 12.2 g/dL (ref 12.0–15.0)
Immature Granulocytes: 0 %
Lymphocytes Relative: 37 %
Lymphs Abs: 2.8 10*3/uL (ref 0.7–4.0)
MCH: 26.8 pg (ref 26.0–34.0)
MCHC: 31.7 g/dL (ref 30.0–36.0)
MCV: 84.4 fL (ref 80.0–100.0)
Monocytes Absolute: 0.5 10*3/uL (ref 0.1–1.0)
Monocytes Relative: 6 %
Neutro Abs: 4.2 10*3/uL (ref 1.7–7.7)
Neutrophils Relative %: 55 %
Platelets: 273 10*3/uL (ref 150–400)
RBC: 4.56 MIL/uL (ref 3.87–5.11)
RDW: 13.8 % (ref 11.5–15.5)
WBC: 7.6 10*3/uL (ref 4.0–10.5)
nRBC: 0 % (ref 0.0–0.2)

## 2021-12-20 LAB — URINALYSIS, ROUTINE W REFLEX MICROSCOPIC
Bilirubin Urine: NEGATIVE
Glucose, UA: NEGATIVE mg/dL
Hgb urine dipstick: NEGATIVE
Ketones, ur: NEGATIVE mg/dL
Leukocytes,Ua: NEGATIVE
Nitrite: NEGATIVE
Protein, ur: NEGATIVE mg/dL
Specific Gravity, Urine: 1.014 (ref 1.005–1.030)
pH: 6 (ref 5.0–8.0)

## 2021-12-20 LAB — LIPASE, BLOOD: Lipase: 25 U/L (ref 11–51)

## 2021-12-20 MED ORDER — HYDROCODONE-ACETAMINOPHEN 5-325 MG PO TABS: 1.0000 | ORAL_TABLET | Freq: Four times a day (QID) | ORAL | 0 refills | Status: DC | PRN

## 2021-12-20 MED ORDER — IOHEXOL 300 MG/ML  SOLN
100.0000 mL | Freq: Once | INTRAMUSCULAR | Status: AC | PRN
  Administered 2021-12-20: 100 mL via INTRAVENOUS

## 2021-12-20 MED ORDER — SODIUM CHLORIDE 0.9 % IV BOLUS
500.0000 mL | Freq: Once | INTRAVENOUS | Status: AC
  Administered 2021-12-20: 500 mL via INTRAVENOUS

## 2021-12-20 MED ORDER — MORPHINE SULFATE (PF) 4 MG/ML IV SOLN
4.0000 mg | Freq: Once | INTRAVENOUS | Status: AC
  Administered 2021-12-20: 4 mg via INTRAVENOUS
  Filled 2021-12-20: qty 1

## 2021-12-20 NOTE — ED Triage Notes (Signed)
Patient complaining of right upper back pain for the past two weeks. States that she has abnormal feeling in abdomen with hx of Pancreatitis and is concerned for same.

## 2021-12-20 NOTE — ED Notes (Signed)
Patient transported to CT 

## 2021-12-20 NOTE — Discharge Instructions (Signed)
As discussed, your evaluation today has been largely reassuring.  But, it is important that you monitor your condition carefully, and do not hesitate to return to the ED if you develop new, or concerning changes in your condition. ? ?Otherwise, please follow-up with your physician for appropriate ongoing care. ? ?

## 2021-12-20 NOTE — ED Provider Notes (Signed)
[facility] EMERGENCY DEPARTMENT Provider Note   CSN: [id number] Arrival date & time: 12/20/21  0998     History  Chief Complaint  Patient presents with   Back Pain    ROMONIA Lin is a 56 y.o. female.  HPI Patient with a history of polymyalgia rheumatica, pancreatitis presents with concern for right flank pain.  Onset was about 1 week ago, since that time pain has been worsening, focal in the right flank without cutaneous changes with associated mild anorexia, but no vomiting.  Patient began taking a dietary supplement about 2 months ago, stopped 3 days ago with consideration of this contributing to her flank pain.  She notes the pain is similar to that she experienced when she had pancreatitis sometime ago, but not entirely the same.    Home Medications Prior to Admission medications   Medication Sig Start Date End Date Taking? Authorizing Provider  HYDROcodone-acetaminophen (NORCO/VICODIN) 5-325 MG tablet Take 1 tablet by mouth every 6 (six) hours as needed for severe pain. 12/20/21  Yes Gerhard Munch, MD  amphetamine-dextroamphetamine (ADDERALL) 30 MG tablet Take 30 mg by mouth 2 (two) times daily.    [provider]  Biotin w/ Vitamins C & E (HAIR SKIN & NAILS GUMMIES) 1250-7.5-7.5 MCG-MG-UNT CHEW Chew 2 tablets by mouth every morning.    [provider]  buPROPion (WELLBUTRIN XL) 150 MG 24 hr tablet Take 150 mg by mouth every evening.    [provider]  citalopram (CELEXA) 40 MG tablet Take 40 mg by mouth every evening.    [provider]  clonazePAM (KLONOPIN) 1 MG tablet Take 1 mg by mouth 2 (two) times daily as needed for anxiety.    [provider]  EPINEPHrine 0.3 mg/0.3 mL IJ SOAJ injection Inject 0.3 mg into the muscle once as needed for anaphylaxis.    [provider]  omeprazole (PRILOSEC) 20 MG capsule Take 20 mg by mouth daily as needed (indigestion).     [provider]  oxyCODONE-acetaminophen  (ROXICET) 5-325 MG per tablet Take 1 tablet by mouth every 4 (four) hours as needed for severe pain. Patient not taking: Reported on 02/09/2016 10/20/14   Meredeth Ide, MD  triamcinolone cream (KENALOG) 0.1 % Apply 1 application topically See admin instructions. Two to three times a day to affected areas    [provider]  valACYclovir (VALTREX) 1000 MG tablet Take 1,000 mg by mouth every 12 (twelve) hours as needed. For fever blister flares 06/17/15   [provider]      Allergies    Sulfa antibiotics    Review of Systems   Review of Systems  All other systems reviewed and are negative.   Physical Exam Updated Vital Signs BP 138/78   Pulse 74   Temp 98 F (36.7 C) (Oral)   Resp 13   Ht 5\' 4"  (1.626 m)   Wt 131.1 kg   SpO2 98%   BMI 49.61 kg/m  Physical Exam Vitals and nursing note reviewed.  Constitutional:      General: She is not in acute distress.    Appearance: She is well-developed. She is obese.  HENT:     Head: Normocephalic and atraumatic.  Eyes:     Conjunctiva/sclera: Conjunctivae normal.  Cardiovascular:     Rate and Rhythm: Normal rate and regular rhythm.  Pulmonary:     Effort: Pulmonary effort is normal. No respiratory distress.     Breath sounds: Normal breath sounds. No stridor.  Abdominal:     General: There is no distension.     Tenderness: There is abdominal tenderness. There is right CVA tenderness.  Skin:    General: Skin is warm and dry.  Neurological:     Mental Status: She is alert and oriented to person, place, and time.     Cranial Nerves: No cranial nerve deficit.  Psychiatric:        Mood and Affect: Mood normal.     ED Results / Procedures / Treatments   Labs (all labs ordered are listed, but only abnormal results are displayed) Labs Reviewed  COMPREHENSIVE METABOLIC PANEL - Abnormal; Notable for the following components:      Result Value   Glucose, Bld 104 (*)    All other components within normal limits   URINALYSIS, ROUTINE W REFLEX MICROSCOPIC - Abnormal; Notable for the following components:   APPearance HAZY (*)    All other components within normal limits  LIPASE, BLOOD  CBC WITH DIFFERENTIAL/PLATELET    EKG None  Radiology CT Abdomen Pelvis W Contrast  Result Date: 12/20/2021 CLINICAL DATA:  Abdominal pain. New right flank and lower thoracic pain. History of pancreatitis. EXAM: CT ABDOMEN AND PELVIS WITH CONTRAST TECHNIQUE: Multidetector CT imaging of the abdomen and pelvis was performed using the standard protocol following bolus administration of intravenous contrast. RADIATION DOSE REDUCTION: This exam was performed according to the departmental dose-optimization program which includes automated exposure control, adjustment of the mA and/or kV according to patient size and/or use of iterative reconstruction technique. CONTRAST:  OMNIPAQUE IOHEXOL 300 MG/ML  SOLN COMPARISON:  08/18/2020 FINDINGS: Lower chest: Unremarkable. Hepatobiliary: The liver shows diffusely decreased attenuation suggesting fat deposition. Tiny hypoattenuating lesion anterior right liver is stable consistent with benign etiology such as a cyst. Gallbladder surgically absent. No intrahepatic or extrahepatic biliary dilation. Pancreas: No focal mass lesion. No dilatation of the main duct. No intraparenchymal cyst. No peripancreatic edema. Spleen: No splenomegaly. No focal mass lesion. Adrenals/Urinary Tract: No adrenal nodule or mass. Kidneys unremarkable. No evidence for hydroureter. The urinary bladder appears normal for the degree of distention. Stomach/Bowel: Stomach is unremarkable. No gastric wall thickening. No evidence of outlet obstruction. Duodenum is normally positioned as is the ligament of Treitz. No small bowel wall thickening. No small bowel dilatation. The terminal ileum is normal. The appendix is not well visualized, but there is no edema or inflammation in the region of the cecum. No gross colonic  mass. No colonic wall thickening. Vascular/Lymphatic: No abdominal aortic aneurysm. There is no gastrohepatic or hepatoduodenal ligament lymphadenopathy. No retroperitoneal or mesenteric lymphadenopathy. No pelvic sidewall lymphadenopathy. Reproductive: Uterus surgically absent. 2.3 cm benign appearing cyst or dominant follicle left ovary is stable in the interval. No followup recommended. Other: No intraperitoneal free fluid. Musculoskeletal: No worrisome lytic or sclerotic osseous abnormality. IMPRESSION: 1. No acute findings in the abdomen or pelvis. Specifically, no findings to explain the patient's history of abdominal pain. 2. Hepatic steatosis. Electronically Signed   By: Kennith Center M.D.   On: 12/20/2021 11:42   DG Chest 2 View  Result Date: 12/20/2021 CLINICAL DATA:  Right-sided rib/flank pain for 2 weeks. EXAM: CHEST - 2 VIEW COMPARISON:  02/09/2016 FINDINGS: Lateral view degraded by patient arm position. Apical lordotic frontal positioning. Midline trachea. Mild cardiomegaly. No pleural effusion or pneumothorax. No congestive failure. Clear lungs. IMPRESSION: Cardiomegaly without congestive failure. Electronically Signed   By: Jeronimo Greaves M.D.   On: 12/20/2021 11:38    Procedures Procedures  Medications Ordered in ED Medications  sodium chloride 0.9 % bolus 500 mL (0 mLs Intravenous Stopped 12/20/21 1146)  morphine (PF) 4 MG/ML injection 4 mg (4 mg Intravenous Given 12/20/21 1012)  iohexol (OMNIPAQUE) 300 MG/ML solution 100 mL (100 mLs Intravenous Contrast Given 12/20/21 1111)    ED Course/ Medical Decision Making/ A&P This patient with a Hx of obesity, polymyalgia, pancreatitis presents to the ED for concern of flank pain, anorexia, this involves an extensive number of treatment options, and is a complaint that carries with it a high risk of complications and morbidity.    The differential diagnosis includes carditis, hepatorenal dysfunction, pneumonia, shingles   Social  Determinants of Health:  Obesity  Additional history obtained:  Additional history and/or information obtained from chart review, notable for history of pancreatitis   After the initial evaluation, orders, including: Labs CT x-ray were initiated.   Patient placed on Cardiac and Pulse-Oximetry Monitors. The patient was maintained on a cardiac monitor.  The cardiac monitored showed an rhythm of 90 sinus normal The patient was also maintained on pulse oximetry. The readings were typically 100% room air normal   On repeat evaluation of the patient improved  Lab Tests:  I personally interpreted labs.  The pertinent results include: Generally reassuring  Imaging Studies ordered:  I independently visualized and interpreted imaging which showed hepatic steatosis no pneumonia, no abscess I agree with the radiologist interpretation  12:52 PMDispostion / Final MDM: At bedside I reviewed the patient's results with her via computer, including evidence for no pancreatic inflammation today, CT with redemonstration of hepatic steatosis.  We discussed possibilities for her flank pain, and recent cessation after using dietary supplement for weight loss is a possibility, as is hepatic steatosis, but no evidence for other acute findings, diverticulitis, abscess, peritonitis, no pneumonia, no evidence for bacteremia, sepsis.  Patient has outpatient follow-up scheduled with GI in the coming weeks with planned colonoscopy and this is appropriate, will be expedited for ongoing monitoring, management.  Patient requests pain medicine on discharge and given her discomfort, this was accommodated.  Final Clinical Impression(s) / ED Diagnoses Final diagnoses:  Flank pain    Rx / DC Orders ED Discharge Orders          Ordered    HYDROcodone-acetaminophen (NORCO/VICODIN) 5-325 MG tablet  Every 6 hours PRN        12/20/21 1251              Gerhard Munch, MD 12/20/21 1253

## 2021-12-28 ENCOUNTER — Other Ambulatory Visit: Payer: Self-pay | Admitting: Family Medicine

## 2021-12-28 DIAGNOSIS — Z1231 Encounter for screening mammogram for malignant neoplasm of breast: Secondary | ICD-10-CM

## 2022-01-19 ENCOUNTER — Ambulatory Visit
Admission: RE | Admit: 2022-01-19 | Discharge: 2022-01-19 | Disposition: A | Discharge status: Home / Self Care | Payer: Medicare PPO | Source: Ambulatory Visit | Attending: Family Medicine | Admitting: Family Medicine

## 2022-01-19 ENCOUNTER — Other Ambulatory Visit: Payer: Self-pay | Admitting: Family Medicine

## 2022-01-19 DIAGNOSIS — Z1231 Encounter for screening mammogram for malignant neoplasm of breast: Secondary | ICD-10-CM

## 2022-01-19 DIAGNOSIS — N644 Mastodynia: Secondary | ICD-10-CM

## 2022-02-08 ENCOUNTER — Ambulatory Visit
Admission: RE | Admit: 2022-02-08 | Discharge: 2022-02-08 | Disposition: A | Discharge status: Home / Self Care | Payer: Medicare PPO | Source: Ambulatory Visit | Attending: Family Medicine | Admitting: Family Medicine

## 2022-02-08 ENCOUNTER — Ambulatory Visit: Admission: RE | Admit: 2022-02-08 | Payer: Medicare PPO | Source: Ambulatory Visit

## 2022-02-08 DIAGNOSIS — N644 Mastodynia: Secondary | ICD-10-CM

## 2023-02-25 ENCOUNTER — Other Ambulatory Visit: Payer: Self-pay

## 2023-02-25 ENCOUNTER — Emergency Department (HOSPITAL_COMMUNITY)
Admission: EM | Admit: 2023-02-25 | Discharge: 2023-02-25 | Disposition: A | Discharge status: Home / Self Care | Payer: Medicare HMO | Attending: Emergency Medicine | Admitting: Emergency Medicine

## 2023-02-25 ENCOUNTER — Encounter (HOSPITAL_COMMUNITY): Payer: Self-pay

## 2023-02-25 ENCOUNTER — Emergency Department (HOSPITAL_COMMUNITY): Payer: Medicare HMO

## 2023-02-25 DIAGNOSIS — M79605 Pain in left leg: Secondary | ICD-10-CM | POA: Insufficient documentation

## 2023-02-25 DIAGNOSIS — R079 Chest pain, unspecified: Secondary | ICD-10-CM | POA: Diagnosis not present

## 2023-02-25 LAB — COMPREHENSIVE METABOLIC PANEL
ALT: 26 U/L (ref 0–44)
AST: 20 U/L (ref 15–41)
Albumin: 3.8 g/dL (ref 3.5–5.0)
Alkaline Phosphatase: 69 U/L (ref 38–126)
Anion gap: 12 (ref 5–15)
BUN: 20 mg/dL (ref 6–20)
CO2: 26 mmol/L (ref 22–32)
Calcium: 9.8 mg/dL (ref 8.9–10.3)
Chloride: 101 mmol/L (ref 98–111)
Creatinine, Ser: 0.76 mg/dL (ref 0.44–1.00)
GFR, Estimated: >60 mL/min (ref >60)
Glucose, Bld: 100 mg/dL — ABNORMAL HIGH (ref 70–99)
Potassium: 4.5 mmol/L (ref 3.5–5.1)
Sodium: 139 mmol/L (ref 135–145)
Total Bilirubin: 0.4 mg/dL (ref 0.3–1.2)
Total Protein: 8 g/dL (ref 6.5–8.1)

## 2023-02-25 LAB — CBC WITH DIFFERENTIAL/PLATELET
Abs Immature Granulocytes: 0.02 10*3/uL (ref 0.00–0.07)
Basophils Absolute: 0.1 10*3/uL (ref 0.0–0.1)
Basophils Relative: 1 %
Eosinophils Absolute: 0.1 10*3/uL (ref 0.0–0.5)
Eosinophils Relative: 1 %
HCT: 39.9 % (ref 36.0–46.0)
Hemoglobin: 12.6 g/dL (ref 12.0–15.0)
Immature Granulocytes: 0 %
Lymphocytes Relative: 40 %
Lymphs Abs: 4 10*3/uL (ref 0.7–4.0)
MCH: 27.9 pg (ref 26.0–34.0)
MCHC: 31.6 g/dL (ref 30.0–36.0)
MCV: 88.3 fL (ref 80.0–100.0)
Monocytes Absolute: 0.6 10*3/uL (ref 0.1–1.0)
Monocytes Relative: 6 %
Neutro Abs: 5.2 10*3/uL (ref 1.7–7.7)
Neutrophils Relative %: 52 %
Platelets: 288 10*3/uL (ref 150–400)
RBC: 4.52 MIL/uL (ref 3.87–5.11)
RDW: 13.5 % (ref 11.5–15.5)
WBC: 10 10*3/uL (ref 4.0–10.5)
nRBC: 0 % (ref 0.0–0.2)

## 2023-02-25 LAB — TROPONIN I (HIGH SENSITIVITY)
Troponin I (High Sensitivity): 3 ng/L (ref <18)
Troponin I (High Sensitivity): 3 ng/L (ref <18)

## 2023-02-25 LAB — LIPASE, BLOOD: Lipase: 36 U/L (ref 11–51)

## 2023-02-25 MED ORDER — KETOROLAC TROMETHAMINE 60 MG/2ML IM SOLN
30.0000 mg | Freq: Once | INTRAMUSCULAR | Status: AC
  Administered 2023-02-25: 30 mg via INTRAMUSCULAR
  Filled 2023-02-25: qty 2

## 2023-02-25 MED ORDER — OXYCODONE-ACETAMINOPHEN 5-325 MG PO TABS: 1.0000 | ORAL_TABLET | Freq: Four times a day (QID) | ORAL | 0 refills | Status: DC | PRN

## 2023-02-25 MED ORDER — CYCLOBENZAPRINE HCL 10 MG PO TABS: 10.0000 mg | ORAL_TABLET | Freq: Two times a day (BID) | ORAL | 0 refills | Status: DC | PRN

## 2023-02-25 NOTE — ED Provider Notes (Signed)
[place] EMERGENCY DEPARTMENT AT [facility] Provider Note   CSN: [id number] Arrival date & time: 02/25/23  1558     History {Add pertinent medical, surgical, social history, OB history to HPI:1} Chief Complaint  Patient presents with   Leg Pain    Hannah Lin is a 57 y.o. female with a past medical history of GERD, polymyalgia rheumatica presents today for evaluation of leg pain and chest pain.  Patient reports that she has has left leg pain in the past 2 days.  Pain is in the back of her left knee going down to the left calf.  She endorses mild swelling.  She also reports intermittent chest pain and shortness of breath in the past month, nonexertional, nonpleuritic.  She denies any fever, cold/chills, nausea, vomiting, cough, runny nose, bowel change, urinary symptoms.  Patient states she has history of pancreatitis and she does have intermittent epigastric abdominal pain as well.  She denies any personal or family history of PE/DVT, no recent long travel, surgery, hemoptysis.   Leg Pain   Past Medical History:  Diagnosis Date   Arthritis    shoulders,Back,feet,top of spine   Depressed    GERD (gastroesophageal reflux disease)    Headache    Hx. of migraines when working   Polymyalgia rheumatica [facility])    Past Surgical History:  Procedure Laterality Date   ABDOMINAL HYSTERECTOMY     anxiety     CHOLECYSTECTOMY     EUS N/A 01/03/2015   Procedure: FULL UPPER ENDOSCOPIC ULTRASOUND (EUS) RADIAL;  Surgeon: Jeani Hawking, MD;  Location: [facility] ENDOSCOPY;  Service: Endoscopy;  Laterality: N/A;     Home Medications Prior to Admission medications   Medication Sig Start Date End Date Taking? Authorizing Provider  amphetamine-dextroamphetamine (ADDERALL) 30 MG tablet Take 30 mg by mouth 2 (two) times daily.    [provider]  Biotin w/ Vitamins C & E (HAIR SKIN & NAILS GUMMIES) 1250-7.5-7.5 MCG-MG-UNT CHEW Chew 2 tablets by mouth every morning.    [provider]  buPROPion (WELLBUTRIN XL) 150 MG 24 hr tablet Take 150 mg by mouth every evening.    [provider]  citalopram (CELEXA) 40 MG tablet Take 40 mg by mouth every evening.    [provider]  clonazePAM (KLONOPIN) 1 MG tablet Take 1 mg by mouth 2 (two) times daily as needed for anxiety.    [provider]  EPINEPHrine 0.3 mg/0.3 mL IJ SOAJ injection Inject 0.3 mg into the muscle once as needed for anaphylaxis.    [provider]  HYDROcodone-acetaminophen (NORCO/VICODIN) 5-325 MG tablet Take 1 tablet by mouth every 6 (six) hours as needed for severe pain. 12/20/21   Gerhard Munch, MD  omeprazole (PRILOSEC) 20 MG capsule Take 20 mg by mouth daily as needed (indigestion).     [provider]  oxyCODONE-acetaminophen (ROXICET) 5-325 MG per tablet Take 1 tablet by mouth every 4 (four) hours as needed for severe pain. Patient not taking: Reported on 02/09/2016 10/20/14   Meredeth Ide, MD  triamcinolone cream (KENALOG) 0.1 % Apply 1 application topically See admin instructions. Two to three times a day to affected areas    [provider]  valACYclovir (VALTREX) 1000 MG tablet Take 1,000 mg by mouth every 12 (twelve) hours as needed. For fever blister flares 06/17/15   [provider]      Allergies    Sulfa antibiotics    Review of Systems   Review  of Systems Negative except as per HPI.  Physical Exam Updated Vital Signs BP 139/78 (BP Location: Right Wrist)   Pulse 75   Temp 98 F (36.7 C) (Oral)   Resp 18   Ht 5\' 4"  (1.626 m)   Wt 135.2 kg   SpO2 99%   BMI 51.15 kg/m  Physical Exam Vitals and nursing note reviewed.  Constitutional:      Appearance: Normal appearance.  HENT:     Head: Normocephalic and atraumatic.     Mouth/Throat:     Mouth: Mucous membranes are moist.  Eyes:     General: No scleral icterus. Cardiovascular:     Rate and Rhythm: Normal rate and regular rhythm.     Pulses: Normal  pulses.     Heart sounds: Normal heart sounds.  Pulmonary:     Effort: Pulmonary effort is normal.     Breath sounds: Normal breath sounds.  Abdominal:     General: Abdomen is flat.     Palpations: Abdomen is soft.     Tenderness: There is no abdominal tenderness.  Musculoskeletal:        General: No deformity.     Comments: Tenderness to palpation to the back of the left knee and left lower leg.  Skin:    General: Skin is warm.     Findings: No rash.  Neurological:     General: No focal deficit present.     Mental Status: She is alert.  Psychiatric:        Mood and Affect: Mood normal.     ED Results / Procedures / Treatments   Labs (all labs ordered are listed, but only abnormal results are displayed) Labs Reviewed  COMPREHENSIVE METABOLIC PANEL - Abnormal; Notable for the following components:      Result Value   Glucose, Bld 100 (*)    All other components within normal limits  LIPASE, BLOOD  CBC WITH DIFFERENTIAL/PLATELET  TROPONIN I (HIGH SENSITIVITY)  TROPONIN I (HIGH SENSITIVITY)    EKG EKG Interpretation Date/Time:  Friday February 25 2023 16:37:18 EDT Ventricular Rate:  83 PR Interval:  166 QRS Duration:  82 QT Interval:  358 QTC Calculation: 420 R Axis:   79  Text Interpretation: Normal sinus rhythm Normal ECG When compared with ECG of 09-Feb-2016 17:02, No significant change was found Confirmed by Vonita Moss [phone]) on 02/25/2023 4:55:04 PM  Radiology US Venous Img Lower Unilateral Left  Result Date: 02/25/2023 CLINICAL DATA:  Left lower extremity pain and swelling for 4 days EXAM: LEFT LOWER EXTREMITY VENOUS DOPPLER ULTRASOUND TECHNIQUE: Gray-scale sonography with compression, as well as color and duplex ultrasound, were performed to evaluate the deep venous system(s) from the level of the common femoral vein through the popliteal and proximal calf veins. COMPARISON:  None Available. FINDINGS: VENOUS Normal compressibility of the common femoral,  superficial femoral, and popliteal veins, as well as the visualized calf veins. Visualized portions of profunda femoral vein and great saphenous vein unremarkable. No filling defects to suggest DVT on grayscale or color Doppler imaging. Doppler waveforms show normal direction of venous flow, normal respiratory plasticity and response to augmentation. Limited views of the contralateral common femoral vein are unremarkable. OTHER None. Limitations: none IMPRESSION: 1. No evidence of deep venous thrombosis within the left lower extremity. Electronically Signed   By: Sharlet Salina M.D.   On: 02/25/2023 18:13   DG Chest 2 View  Result Date: 02/25/2023 CLINICAL DATA:  Left leg pain for 4 days, chest  pain EXAM: CHEST - 2 VIEW COMPARISON:  12/20/2021 FINDINGS: Frontal and lateral views of the chest demonstrate a stable enlarged cardiac silhouette. No airspace disease, effusion, or pneumothorax. No acute bony abnormalities. IMPRESSION: 1. Stable chest, no acute process. Electronically Signed   By: Sharlet Salina M.D.   On: 02/25/2023 18:12    Procedures Procedures  {Document cardiac monitor, telemetry assessment procedure when appropriate:1}  Medications Ordered in ED Medications  ketorolac (TORADOL) injection 30 mg (has no administration in time range)    ED Course/ Medical Decision Making/ A&P   {   Click here for ABCD2, HEART and other calculatorsREFRESH Note before signing :1}                              Medical Decision Making Amount and/or Complexity of Data Reviewed Labs: ordered. Radiology: ordered.  Risk Prescription drug management.   This patient presents to the ED for leg pain, chest pain, this involves an extensive number of treatment options, and is a complaint that carries with a high risk of complications and morbidity.  The differential diagnosis includes DVT, ACS/MI, cellulitis, pneumonia, pneumothorax, asthma, COPD.  This is not an exhaustive list.  Lab tests: I ordered  and personally interpreted labs.  The pertinent results include: WBC unremarkable. Hbg unremarkable. Platelets unremarkable. Electrolytes unremarkable. BUN, creatinine unremarkable.  Delta troponin is negative.  Lipase is normal.  Imaging studies: I ordered imaging studies, personally reviewed, interpreted imaging and agree with the radiologist's interpretations. The results include: Negative chest x-ray.  Ultrasound of the left lower extremity showed no evidence of DVT.  Problem list/ ED course/ Critical interventions/ Medical management: HPI: See above Vital signs within normal range and stable throughout visit. Laboratory/imaging studies significant for: See above. On physical examination, patient is afebrile and appears in no acute distress. *** I have reviewed the patient home medicines and have made adjustments as needed.  Cardiac monitoring/EKG: The patient was maintained on a cardiac monitor.  I personally reviewed and interpreted the cardiac monitor which showed an underlying rhythm of: sinus rhythm.  Additional history obtained: External records from outside source obtained and reviewed including: Chart review including previous notes, labs, imaging.  Consultations obtained:  Disposition Continued outpatient therapy. Follow-up with PCP recommended for reevaluation of symptoms. Treatment plan discussed with patient.  Pt acknowledged understanding was agreeable to the plan. Worrisome signs and symptoms were discussed with patient, and patient acknowledged understanding to return to the ED if they noticed these signs and symptoms. Patient was stable upon discharge.   This chart was dictated using voice recognition software.  Despite best efforts to proofread,  errors can occur which can change the documentation meaning.    {Document critical care time when appropriate:1} {Document review of labs and clinical decision tools ie heart score, Chads2Vasc2 etc:1}  {Document your  independent review of radiology images, and any outside records:1} {Document your discussion with family members, caretakers, and with consultants:1} {Document social determinants of health affecting pt's care:1} {Document your decision making why or why not admission, treatments were needed:1} Final Clinical Impression(s) / ED Diagnoses Final diagnoses:  None    Rx / DC Orders ED Discharge Orders     None

## 2023-02-25 NOTE — Discharge Instructions (Signed)
Please take tylenol/ibuprofen/naproxen, muscle relaxants or percocet as needed for pain. I recommend close follow-up with PCP and cardiology for reevaluation.  Please do not hesitate to return to emergency department if worrisome signs symptoms we discussed become apparent.

## 2023-02-25 NOTE — ED Provider Triage Note (Signed)
Emergency Medicine Provider Triage Evaluation Note  ROSAISELA LONA , a 57 y.o. female  was evaluated in triage.  Pt complains of left leg pain in the past 2 days.  Pain is in the back of her knee go down to her calf.  Endorses mild swelling.  She also reports intermittent chest pain and shortness of breath in the past month.  Denies any fever, cold/chills, nausea, vomiting, cough, runny nose, bowel change, urine symptoms.  Patient states she has history of pancreatitis and she does have intermittent epigastric abdominal pain as well.  Review of Systems  Positive: As above Negative: As above  Physical Exam  BP (!) 144/87   Pulse 89   Temp 98 F (36.7 C) (Oral)   Resp 16   Ht 5\' 4"  (1.626 m)   Wt 135.2 kg   SpO2 97%   BMI 51.15 kg/m  Gen:   Awake, no distress   Resp:  Normal effort  MSK:   Moves extremities without difficulty  Other:  Tenderness to palpation to posterior area of left knee and calf.  Medical Decision Making  Medically screening exam initiated at 4:30 PM.  Appropriate orders placed.  CHILD BEDWARD was informed that the remainder of the evaluation will be completed by another provider, this initial triage assessment does not replace that evaluation, and the importance of remaining in the ED until their evaluation is complete.    Jeanelle Malling, [place] 02/25/23 [phone]

## 2023-02-25 NOTE — ED Triage Notes (Signed)
LEFT leg pain x4 days  Denies falls or trauma Complains of LEFT foot and ankle swelling

## 2023-02-25 NOTE — ED Notes (Signed)
12 lead completed Pt in XRAY

## 2023-02-25 NOTE — ED Notes (Signed)
Pt back from US

## 2023-02-25 NOTE — ED Notes (Signed)
Pt returned form xray Lab collecting blood now

## 2023-04-13 DIAGNOSIS — R002 Palpitations: Secondary | ICD-10-CM | POA: Insufficient documentation

## 2023-04-13 NOTE — Progress Notes (Unsigned)
Cardiology Office Note   Date:  04/13/2023   ID:  Bunny, Hort [birth year], MRN [id number]  PCP:  Barbie Banner, MD  Cardiologist:   None Referring:  ***  No chief complaint on file.     History of Present Illness: Hannah Lin is a 56 y.o. female who was referred by *** for evaluation of palpitations.  ***    Past Medical History:  Diagnosis Date   Arthritis    shoulders,Back,feet,top of spine   Depressed    GERD (gastroesophageal reflux disease)    Headache    Hx. of migraines when working   Polymyalgia rheumatica [facility])     Past Surgical History:  Procedure Laterality Date   ABDOMINAL HYSTERECTOMY     anxiety     CHOLECYSTECTOMY     EUS N/A 01/03/2015   Procedure: FULL UPPER ENDOSCOPIC ULTRASOUND (EUS) RADIAL;  Surgeon: Jeani Hawking, MD;  Location: [facility] ENDOSCOPY;  Service: Endoscopy;  Laterality: N/A;     Current Outpatient Medications  Medication Sig Dispense Refill   amphetamine-dextroamphetamine (ADDERALL) 30 MG tablet Take 30 mg by mouth 2 (two) times daily.     Biotin w/ Vitamins C & E (HAIR SKIN & NAILS GUMMIES) 1250-7.5-7.5 MCG-MG-UNT CHEW Chew 2 tablets by mouth every morning.     buPROPion (WELLBUTRIN XL) 150 MG 24 hr tablet Take 150 mg by mouth every evening.     citalopram (CELEXA) 40 MG tablet Take 40 mg by mouth every evening.     clonazePAM (KLONOPIN) 1 MG tablet Take 1 mg by mouth 2 (two) times daily as needed for anxiety.     cyclobenzaprine (FLEXERIL) 10 MG tablet Take 1 tablet (10 mg total) by mouth 2 (two) times daily as needed for muscle spasms. 20 tablet 0   EPINEPHrine 0.3 mg/0.3 mL IJ SOAJ injection Inject 0.3 mg into the muscle once as needed for anaphylaxis.     HYDROcodone-acetaminophen (NORCO/VICODIN) 5-325 MG tablet Take 1 tablet by mouth every 6 (six) hours as needed for severe pain. 10 tablet 0   omeprazole (PRILOSEC) 20 MG capsule Take 20 mg by mouth daily as needed (indigestion).      oxyCODONE-acetaminophen  (PERCOCET/ROXICET) 5-325 MG tablet Take 1 tablet by mouth every 6 (six) hours as needed for severe pain (pain score 7-10). 8 tablet 0   oxyCODONE-acetaminophen (ROXICET) 5-325 MG per tablet Take 1 tablet by mouth every 4 (four) hours as needed for severe pain. (Patient not taking: Reported on 02/09/2016) 14 tablet 0   triamcinolone cream (KENALOG) 0.1 % Apply 1 application topically See admin instructions. Two to three times a day to affected areas     valACYclovir (VALTREX) 1000 MG tablet Take 1,000 mg by mouth every 12 (twelve) hours as needed. For fever blister flares     No current facility-administered medications for this visit.    Allergies:   Sulfa antibiotics    Social History:  The patient  reports that she has never smoked. She has never used smokeless tobacco. She reports that she does not drink alcohol and does not use drugs.   Family History:  The patient's ***family history is not on file.    ROS:  Please see the history of present illness.   Otherwise, review of systems are positive for {NONE DEFAULTED:18576}.   All other systems are reviewed and negative.    PHYSICAL EXAM: VS:  There were no vitals taken for this visit. , BMI There is no height or  weight on file to calculate BMI. GENERAL:  Well appearing HEENT:  Pupils equal round and reactive, fundi not visualized, oral mucosa unremarkable NECK:  No jugular venous distention, waveform within normal limits, carotid upstroke brisk and symmetric, no bruits, no thyromegaly LYMPHATICS:  No cervical, inguinal adenopathy LUNGS:  Clear to auscultation bilaterally BACK:  No CVA tenderness CHEST:  Unremarkable HEART:  PMI not displaced or sustained,S1 and S2 within normal limits, no S3, no S4, no clicks, no rubs, *** murmurs ABD:  Flat, positive bowel sounds normal in frequency in pitch, no bruits, no rebound, no guarding, no midline pulsatile mass, no hepatomegaly, no splenomegaly EXT:  2 plus pulses throughout, no edema, no  cyanosis no clubbing SKIN:  No rashes no nodules NEURO:  Cranial nerves II through XII grossly intact, motor grossly intact throughout PSYCH:  Cognitively intact, oriented to person place and time    EKG:        Recent Labs: 02/25/2023: ALT 26; BUN 20; Creatinine, Ser 0.76; Hemoglobin 12.6; Platelets 288; Potassium 4.5; Sodium 139    Lipid Panel    Component Value Date/Time   CHOL 232 (H) 10/17/2014 0102   TRIG 64 10/17/2014 0102   HDL 65 10/17/2014 0102   CHOLHDL 3.6 10/17/2014 0102   VLDL 13 10/17/2014 0102   LDLCALC 154 (H) 10/17/2014 0102      Wt Readings from Last 3 Encounters:  02/25/23 298 lb (135.2 kg)  12/20/21 289 lb (131.1 kg)  08/18/20 (!) 311 lb 4.6 oz (141.2 kg)      Other studies Reviewed: Additional studies/ records that were reviewed today include: ***. Review of the above records demonstrates:  Please see elsewhere in the note.  ***   ASSESSMENT AND PLAN:  Palpitations:  ***   Current medicines are reviewed at length with the patient today.  The patient {ACTIONS; HAS/DOES NOT HAVE:19233} concerns regarding medicines.  The following changes have been made:  {PLAN; NO CHANGE:13088:s}  Labs/ tests ordered today include: *** No orders of the defined types were placed in this encounter.    Disposition:   FU with ***    Signed, Rollene Rotunda, MD  04/13/2023 4:20 PM     [facility]

## 2023-04-15 ENCOUNTER — Ambulatory Visit: Payer: Medicare HMO | Admitting: Cardiology

## 2023-04-15 DIAGNOSIS — R002 Palpitations: Secondary | ICD-10-CM

## 2023-05-08 ENCOUNTER — Encounter (HOSPITAL_COMMUNITY): Payer: Self-pay | Admitting: *Deleted

## 2023-05-08 ENCOUNTER — Emergency Department (HOSPITAL_COMMUNITY): Payer: Medicare HMO

## 2023-05-08 ENCOUNTER — Emergency Department (HOSPITAL_COMMUNITY)
Admission: EM | Admit: 2023-05-08 | Discharge: 2023-05-08 | Disposition: A | Discharge status: Home / Self Care | Payer: Medicare HMO | Attending: Emergency Medicine | Admitting: Emergency Medicine

## 2023-05-08 ENCOUNTER — Other Ambulatory Visit: Payer: Self-pay

## 2023-05-08 DIAGNOSIS — R109 Unspecified abdominal pain: Secondary | ICD-10-CM | POA: Diagnosis not present

## 2023-05-08 DIAGNOSIS — M549 Dorsalgia, unspecified: Secondary | ICD-10-CM | POA: Diagnosis present

## 2023-05-08 DIAGNOSIS — M545 Low back pain, unspecified: Secondary | ICD-10-CM

## 2023-05-08 LAB — CBC WITH DIFFERENTIAL/PLATELET
Abs Immature Granulocytes: 0.02 10*3/uL (ref 0.00–0.07)
Basophils Absolute: 0.1 10*3/uL (ref 0.0–0.1)
Basophils Relative: 1 %
Eosinophils Absolute: 0.1 10*3/uL (ref 0.0–0.5)
Eosinophils Relative: 1 %
HCT: 39.3 % (ref 36.0–46.0)
Hemoglobin: 12.3 g/dL (ref 12.0–15.0)
Immature Granulocytes: 0 %
Lymphocytes Relative: 35 %
Lymphs Abs: 3 10*3/uL (ref 0.7–4.0)
MCH: 27.3 pg (ref 26.0–34.0)
MCHC: 31.3 g/dL (ref 30.0–36.0)
MCV: 87.1 fL (ref 80.0–100.0)
Monocytes Absolute: 0.5 10*3/uL (ref 0.1–1.0)
Monocytes Relative: 5 %
Neutro Abs: 5 10*3/uL (ref 1.7–7.7)
Neutrophils Relative %: 58 %
Platelets: 304 10*3/uL (ref 150–400)
RBC: 4.51 MIL/uL (ref 3.87–5.11)
RDW: 14.2 % (ref 11.5–15.5)
WBC: 8.6 10*3/uL (ref 4.0–10.5)
nRBC: 0 % (ref 0.0–0.2)

## 2023-05-08 LAB — URINALYSIS, ROUTINE W REFLEX MICROSCOPIC
Bacteria, UA: NONE SEEN
Bilirubin Urine: NEGATIVE
Glucose, UA: NEGATIVE mg/dL
Hgb urine dipstick: NEGATIVE
Ketones, ur: NEGATIVE mg/dL
Leukocytes,Ua: NEGATIVE
Nitrite: NEGATIVE
Protein, ur: 30 mg/dL — AB
Specific Gravity, Urine: 1.029 (ref 1.005–1.030)
pH: 5 (ref 5.0–8.0)

## 2023-05-08 LAB — LIPASE, BLOOD: Lipase: 29 U/L (ref 11–51)

## 2023-05-08 LAB — COMPREHENSIVE METABOLIC PANEL
ALT: 29 U/L (ref 0–44)
AST: 25 U/L (ref 15–41)
Albumin: 3.7 g/dL (ref 3.5–5.0)
Alkaline Phosphatase: 60 U/L (ref 38–126)
Anion gap: 9 (ref 5–15)
BUN: 19 mg/dL (ref 6–20)
CO2: 23 mmol/L (ref 22–32)
Calcium: 8.8 mg/dL — ABNORMAL LOW (ref 8.9–10.3)
Chloride: 105 mmol/L (ref 98–111)
Creatinine, Ser: 0.71 mg/dL (ref 0.44–1.00)
GFR, Estimated: >60 mL/min (ref >60)
Glucose, Bld: 111 mg/dL — ABNORMAL HIGH (ref 70–99)
Potassium: 3.8 mmol/L (ref 3.5–5.1)
Sodium: 137 mmol/L (ref 135–145)
Total Bilirubin: 0.5 mg/dL (ref 0.0–1.2)
Total Protein: 7.8 g/dL (ref 6.5–8.1)

## 2023-05-08 MED ORDER — HYDROCODONE-ACETAMINOPHEN 5-325 MG PO TABS: 1.0000 | ORAL_TABLET | Freq: Four times a day (QID) | ORAL | 0 refills | Status: DC | PRN

## 2023-05-08 MED ORDER — NAPROXEN 500 MG PO TABS: 500.0000 mg | ORAL_TABLET | Freq: Two times a day (BID) | ORAL | 0 refills | Status: DC

## 2023-05-08 MED ORDER — KETOROLAC TROMETHAMINE 15 MG/ML IJ SOLN
15.0000 mg | Freq: Once | INTRAMUSCULAR | Status: AC
  Administered 2023-05-08: 15 mg via INTRAMUSCULAR
  Filled 2023-05-08: qty 1

## 2023-05-08 MED ORDER — LIDOCAINE 5 % EX PTCH
1.0000 | MEDICATED_PATCH | CUTANEOUS | Status: DC
  Administered 2023-05-08: 1 via TRANSDERMAL
  Filled 2023-05-08: qty 1

## 2023-05-08 NOTE — ED Provider Notes (Signed)
 [place] EMERGENCY DEPARTMENT AT [facility] Provider Note   CSN: [id number] Arrival date & time: 05/08/23  1343     History  Chief Complaint  Patient presents with   Back Pain    Hannah Lin is a 58 y.o. female.  PMH of pancreatitis, fatty liver, chronic back pain and polymyalgia rheumatica.  Presents to ER today complaining of right flank pain x 3 to 4 weeks.  It is constant sharp pain that is worse with any movement.  It is different than her chronic back pain though this is bothering her as well.  She has had some nausea associated with this and is worried it could possibly be pancreatitis.  She has tried Tylenol  and ibuprofen  without relief and states she is very worried it could be her pancreas, liver or kidney and wanted to be evaluated today.   Back Pain      Home Medications Prior to Admission medications   Medication Sig Start Date End Date Taking? Authorizing Provider  amphetamine -dextroamphetamine  (ADDERALL) 30 MG tablet Take 30 mg by mouth 2 (two) times daily.    [provider]  Biotin w/ Vitamins C & E (HAIR SKIN & NAILS GUMMIES) 1250-7.5-7.5 MCG-MG-UNT CHEW Chew 2 tablets by mouth every morning.    [provider]  buPROPion  (WELLBUTRIN  XL) 150 MG 24 hr tablet Take 150 mg by mouth every evening.    [provider]  citalopram  (CELEXA ) 40 MG tablet Take 40 mg by mouth every evening.    [provider]  clonazePAM  (KLONOPIN ) 1 MG tablet Take 1 mg by mouth 2 (two) times daily as needed for anxiety.    [provider]  cyclobenzaprine  (FLEXERIL ) 10 MG tablet Take 1 tablet (10 mg total) by mouth 2 (two) times daily as needed for muscle spasms. 02/25/23   Ladora Congress, PA  EPINEPHrine 0.3 mg/0.3 mL IJ SOAJ injection Inject 0.3 mg into the muscle once as needed for anaphylaxis.    [provider]  HYDROcodone -acetaminophen  (NORCO/VICODIN) 5-325 MG tablet Take 1 tablet by mouth every 6 (six) hours as needed for  severe pain. 12/20/21   Garrick Charleston, MD  omeprazole (PRILOSEC) 20 MG capsule Take 20 mg by mouth daily as needed (indigestion).     [provider]  oxyCODONE -acetaminophen  (PERCOCET/ROXICET) 5-325 MG tablet Take 1 tablet by mouth every 6 (six) hours as needed for severe pain (pain score 7-10). 02/25/23   Ladora Congress, PA  oxyCODONE -acetaminophen  (ROXICET) 5-325 MG per tablet Take 1 tablet by mouth every 4 (four) hours as needed for severe pain. Patient not taking: Reported on 02/09/2016 10/20/14   Drusilla Sabas RAMAN, MD  triamcinolone cream (KENALOG) 0.1 % Apply 1 application topically See admin instructions. Two to three times a day to affected areas    [provider]  valACYclovir (VALTREX) 1000 MG tablet Take 1,000 mg by mouth every 12 (twelve) hours as needed. For fever blister flares 06/17/15   [provider]      Allergies    Sulfa antibiotics    Review of Systems   Review of Systems  Musculoskeletal:  Positive for back pain.    Physical Exam Updated Vital Signs BP (!) 154/83 (BP Location: Right Arm)   Pulse 84   Temp 98 F (36.7 C) (Oral)   Resp 20   Ht 5' 4 (1.626 m)   Wt 135.2 kg   SpO2 94%   BMI 51.15 kg/m  Physical Exam Vitals and nursing note reviewed.  Constitutional:      General: She is not in acute distress.    Appearance: She is well-developed.  HENT:     Head: Normocephalic and atraumatic.  Eyes:     Conjunctiva/sclera: Conjunctivae normal.  Cardiovascular:     Rate and Rhythm: Normal rate and regular rhythm.     Heart sounds: No murmur heard. Pulmonary:     Effort: Pulmonary effort is normal. No respiratory distress.     Breath sounds: Normal breath sounds.  Abdominal:     Palpations: Abdomen is soft.     Tenderness: There is no abdominal tenderness.  Musculoskeletal:        General: No swelling.     Cervical back: Neck supple.  Skin:    General: Skin is warm and dry.     Capillary Refill: Capillary refill takes less than 2  seconds.  Neurological:     Mental Status: She is alert and oriented to person, place, and time.  Psychiatric:        Mood and Affect: Mood normal.     ED Results / Procedures / Treatments   Labs (all labs ordered are listed, but only abnormal results are displayed) Labs Reviewed - No data to display  EKG None  Radiology No results found.  Procedures Procedures    Medications Ordered in ED Medications - No data to display  ED Course/ Medical Decision Making/ A&P                                 Medical Decision Making DD X: Pancreatitis, ureterolithiasis, appendicitis, muscle strain, contusion, other Patient having several weeks to a month of right flank pain described as sharp pain worse with moving, she is presented worried that could be related to a kidney, pancreas or liver problem.  Her exam is reassuring she had no red flags clearly no weight loss, no fevers or chills, no saddle esthesia or paresthesia, no bowel or bladder incontinence, not having dysuria.  Labs are all reassuring, normal lipase, normal renal function, CT showed fatty liver which patient already is aware of is going to follow-up with her GI doctor for no acute findings.  Given analgesics and advised to follow-up closely with her PCP.  She is given Toradol  in the ER for pain, initially had mild pain relief but then pain came back, given a small amount of hydrocodone  for when pain is more severe, given prescription otherwise.  She says she has had almost relaxers in the past and it helped her in the declined offer of muscle relaxer, states she did not have improvement with Lidoderm  patch. Ambulating well in the ED, well-appearing, advised on follow-up and return precautions.  Amount and/or Complexity of Data Reviewed Labs: ordered. Radiology: ordered.  Risk Prescription drug management.           Final Clinical Impression(s) / ED Diagnoses Final diagnoses:  None    Rx / DC Orders ED  Discharge Orders     None         Suellen Sherran DELENA DEVONNA 05/08/23 2233    Patsey Lot, MD 05/09/23 [phone]

## 2023-05-08 NOTE — ED Triage Notes (Signed)
 Pt with chronic lower back pain, mid back pain on right x 3-4 weeks. Worse with movement. + nausea

## 2023-05-08 NOTE — Discharge Instructions (Signed)
 You are seen today for back pain on the right side of the pigment from the past month.  I am prescribing the naproxen  which she states has helped in the past, given a small amount of hydrocodone  for several days to take only when pain is not controlled with either naproxen  or Tylenol .  Fortunately your workup was overall reassuring.  There is no sign of problem with kidney, liver or your pancreas.  The CT scan did show fatty liver as we discussed, you to follow-up with a GI doctor for this.

## 2023-05-09 MED FILL — Hydrocodone-Acetaminophen Tab 5-325 MG: ORAL | Qty: 6 | Status: AC

## 2023-05-18 ENCOUNTER — Ambulatory Visit: Payer: Medicare HMO | Admitting: Internal Medicine

## 2023-05-27 ENCOUNTER — Ambulatory Visit: Payer: Medicare HMO | Admitting: Internal Medicine

## 2023-09-30 ENCOUNTER — Ambulatory Visit (INDEPENDENT_AMBULATORY_CARE_PROVIDER_SITE_OTHER)

## 2023-09-30 ENCOUNTER — Encounter: Payer: Self-pay | Admitting: Podiatry

## 2023-09-30 ENCOUNTER — Ambulatory Visit (INDEPENDENT_AMBULATORY_CARE_PROVIDER_SITE_OTHER): Admitting: Podiatry

## 2023-09-30 DIAGNOSIS — R2681 Unsteadiness on feet: Secondary | ICD-10-CM

## 2023-09-30 DIAGNOSIS — M7731 Calcaneal spur, right foot: Secondary | ICD-10-CM

## 2023-09-30 DIAGNOSIS — M7661 Achilles tendinitis, right leg: Secondary | ICD-10-CM | POA: Diagnosis not present

## 2023-09-30 MED ORDER — METHYLPREDNISOLONE 4 MG PO TBPK: ORAL_TABLET | ORAL | 0 refills | Status: DC

## 2023-09-30 MED ORDER — MELOXICAM 15 MG PO TABS
15.0000 mg | ORAL_TABLET | Freq: Every day | ORAL | 0 refills | Status: DC
Start: 2023-09-30 — End: 2023-10-27

## 2023-09-30 NOTE — Progress Notes (Signed)
 Chief Complaint  Patient presents with   Foot Pain    Rm 17 patient complains of pain in right heel that started 3 months ago/ constant pain/patient has tried over counter inserts and shoe gear changes with no improvement.    HPI: 58 y.o. female presents today with pain to the back of right heel.  It has been going on for several months at this point.  Is worse with activity.  It is worse going barefoot and with supportive shoes.  She has tried some over-the-counter inserts and tried various types of shoes.  She also complains of left foot instability with gait feeling like her foot is rolling in.  Past Medical History:  Diagnosis Date   Arthritis    shoulders,Back,feet,top of spine   Depressed    GERD (gastroesophageal reflux disease)    Headache    Hx. of migraines when working   Polymyalgia rheumatica [facility])     Past Surgical History:  Procedure Laterality Date   ABDOMINAL HYSTERECTOMY     anxiety     CHOLECYSTECTOMY     EUS N/A 01/03/2015   Procedure: FULL UPPER ENDOSCOPIC ULTRASOUND (EUS) RADIAL;  Surgeon: Alvis Jourdain, MD;  Location: [facility];  Service: Endoscopy;  Laterality: N/A;    Allergies  Allergen Reactions   Sulfa Antibiotics Hives    ROS denies any nausea, vomiting, fever, chills, chest pain, shortness breath   Physical Exam: There were no vitals filed for this visit.  General: The patient is alert and oriented x3 in no acute distress.  Dermatology: Skin is warm, dry and supple bilateral lower extremities. Interspaces are clear of maceration and debris.    Vascular: Palpable pedal pulses bilaterally. Capillary refill within normal limits.  No appreciable edema.  No erythema or calor.  Neurological: Light touch sensation grossly intact bilateral feet.   Musculoskeletal Exam: Pain on palpation of right Achilles at level of insertion, posterior heel.  No pain on palpation of Achilles tendon watershed area.  No pain on palpation of plantar heel or  medial band plantar fascia.  Muscle strength 5/5 for all major muscle groups.  Left foot no gross abnormalities.  On gait does well lateral column and invert foot.  Radiographic Exam: Right foot 3 views weightbearing 09/30/2023 Normal osseous mineralization. Joint spaces preserved.  No fractures noted.  There is some spurring present to posterior calcaneal tubercle at level of Achilles insertion.  No calcifications noted within Achilles tendon.  Assessment/Plan of Care: 1. Achilles tendinitis of right lower extremity   2. Heel spur, right   3. Unsteady gait when walking      Meds ordered this encounter  Medications   methylPREDNISolone  (MEDROL  DOSEPAK) 4 MG TBPK tablet    Sig: 6 Day Tapering Dose    Dispense:  21 tablet    Refill:  0   meloxicam  (MOBIC ) 15 MG tablet    Sig: Take 1 tablet (15 mg total) by mouth daily.    Dispense:  30 tablet    Refill:  0   DG FOOT COMPLETE RIGHT  Discussed clinical findings with patient today.  Radiographs reviewed with patient  Discussed findings of Achilles insertional tendinitis. - Medications: Starting course of Medrol  Dosepak followed by 15 milligrams meloxicam  once a day upon completion of steroid course - Period of cam boot immobilization due to long-term process at this point.  Dispensed 2 heel lifts as well, start with 2 over the next 2 weeks then decrease to 1 -  Home PT and stretching regimen discussed with patient, written instructions dispensed.  Okay to come out of cam boot for stretching regimen - RICE therapy as needed - Left foot gait instability: Dispensing power steps which will help control rear foot and midfoot motion/position throughout gait.  This will also be beneficial for patient as she gets out of the cam boot.  Follow-up in 3 weeks for right heel Achilles insertional tendinitis  Guneet Delpino L. Lunda Salines, AACFAS [facility]     [address] [place], [place] [address]                 Office [phone]  Fax [phone]

## 2023-09-30 NOTE — Patient Instructions (Signed)
Achilles Tendinitis  with Rehab Achilles tendinitis is a disorder of the Achilles tendon. The Achilles tendon connects the large calf muscles (Gastrocnemius and Soleus) to the heel bone (calcaneus). This tendon is sometimes called the heel cord. It is important for pushing-off and standing on your toes and is important for walking, running, or jumping. Tendinitis is often caused by overuse and repetitive microtrauma. SYMPTOMS Pain, tenderness, swelling, warmth, and redness may occur over the Achilles tendon even at rest. Pain with pushing off, or flexing or extending the ankle. Pain that is worsened after or during activity. CAUSES  Overuse sometimes seen with rapid increase in exercise programs or in sports requiring running and jumping. Poor physical conditioning (strength and flexibility or endurance). Running sports, especially training running down hills. Inadequate warm-up before practice or play or failure to stretch before participation. Injury to the tendon. PREVENTION  Warm up and stretch before practice or competition. Allow time for adequate rest and recovery between practices and competition. Keep up conditioning. Keep up ankle and leg flexibility. Improve or keep muscle strength and endurance. Improve cardiovascular fitness. Use proper technique. Use proper equipment (shoes, skates). To help prevent recurrence, taping, protective strapping, or an adhesive bandage may be recommended for several weeks after healing is complete. PROGNOSIS  Recovery may take weeks to several months to heal. Longer recovery is expected if symptoms have been prolonged. Recovery is usually quicker if the inflammation is due to a direct blow as compared with overuse or sudden strain. RELATED COMPLICATIONS  Healing time will be prolonged if the condition is not correctly treated. The injury must be given plenty of time to heal. Symptoms can reoccur if activity is resumed too soon. Untreated,  tendinitis may increase the risk of tendon rupture requiring additional time for recovery and possibly surgery. TREATMENT  The first treatment consists of rest anti-inflammatory medication, and ice to relieve the pain. Stretching and strengthening exercises after resolution of pain will likely help reduce the risk of recurrence. Referral to a physical therapist or athletic trainer for further evaluation and treatment may be helpful. A walking boot or cast may be recommended to rest the Achilles tendon. This can help break the cycle of inflammation and microtrauma. Arch supports (orthotics) may be prescribed or recommended by your caregiver as an adjunct to therapy and rest. Surgery to remove the inflamed tendon lining or degenerated tendon tissue is rarely necessary and has shown less than predictable results. MEDICATION  Nonsteroidal anti-inflammatory medications, such as aspirin and ibuprofen, may be used for pain and inflammation relief. Do not take within 7 days before surgery. Take these as directed by your caregiver. Contact your caregiver immediately if any bleeding, stomach upset, or signs of allergic reaction occur. Other minor pain relievers, such as acetaminophen, may also be used. Pain relievers may be prescribed as necessary by your caregiver. Do not take prescription pain medication for longer than 4 to 7 days. Use only as directed and only as much as you need. Cortisone injections are rarely indicated. Cortisone injections may weaken tendons and predispose to rupture. It is better to give the condition more time to heal than to use them. HEAT AND COLD Cold is used to relieve pain and reduce inflammation for acute and chronic Achilles tendinitis. Cold should be applied for 10 to 15 minutes every 2 to 3 hours for inflammation and pain and immediately after any activity that aggravates your symptoms. Use ice packs or an ice massage. Heat may be used before performing stretching  and  strengthening activities prescribed by your caregiver. Use a heat pack or a warm soak. SEEK MEDICAL CARE IF: Symptoms get worse or do not improve in 2 weeks despite treatment. New, unexplained symptoms develop. Drugs used in treatment may produce side effects.  EXERCISES:  RANGE OF MOTION (ROM) AND STRETCHING EXERCISES - Achilles Tendinitis  These exercises may help you when beginning to rehabilitate your injury. Your symptoms may resolve with or without further involvement from your physician, physical therapist or athletic trainer. While completing these exercises, remember:  Restoring tissue flexibility helps normal motion to return to the joints. This allows healthier, less painful movement and activity. An effective stretch should be held for at least 30 seconds. A stretch should never be painful. You should only feel a gentle lengthening or release in the stretched tissue.  STRETCH  Gastroc, Standing  Place hands on wall. Extend right / left leg, keeping the front knee somewhat bent. Slightly point your toes inward on your back foot. Keeping your right / left heel on the floor and your knee straight, shift your weight toward the wall, not allowing your back to arch. You should feel a gentle stretch in the right / left calf. Hold this position for 10 seconds. Repeat 3 times. Complete this stretch 2 times per day.  STRETCH  Soleus, Standing  Place hands on wall. Extend right / left leg, keeping the other knee somewhat bent. Slightly point your toes inward on your back foot. Keep your right / left heel on the floor, bend your back knee, and slightly shift your weight over the back leg so that you feel a gentle stretch deep in your back calf. Hold this position for 10 seconds. Repeat 3 times. Complete this stretch 2 times per day.  STRETCH  Gastrocsoleus, Standing  Note: This exercise can place a lot of stress on your foot and ankle. Please complete this exercise only if specifically  instructed by your caregiver.  Place the ball of your right / left foot on a step, keeping your other foot firmly on the same step. Hold on to the wall or a rail for balance. Slowly lift your other foot, allowing your body weight to press your heel down over the edge of the step. You should feel a stretch in your right / left calf. Hold this position for 10 seconds. Repeat this exercise with a slight bend in your knee. Repeat 3 times. Complete this stretch 2 times per day.   STRENGTHENING EXERCISES - Achilles Tendinitis These exercises may help you when beginning to rehabilitate your injury. They may resolve your symptoms with or without further involvement from your physician, physical therapist or athletic trainer. While completing these exercises, remember:  Muscles can gain both the endurance and the strength needed for everyday activities through controlled exercises. Complete these exercises as instructed by your physician, physical therapist or athletic trainer. Progress the resistance and repetitions only as guided. You may experience muscle soreness or fatigue, but the pain or discomfort you are trying to eliminate should never worsen during these exercises. If this pain does worsen, stop and make certain you are following the directions exactly. If the pain is still present after adjustments, discontinue the exercise until you can discuss the trouble with your clinician.  STRENGTH - Plantar-flexors  Sit with your right / left leg extended. Holding onto both ends of a rubber exercise band/tubing, loop it around the ball of your foot. Keep a slight tension in the band. Slowly  push your toes away from you, pointing them downward. Hold this position for 10 seconds. Return slowly, controlling the tension in the band/tubing. Repeat 3 times. Complete this exercise 2 times per day.   STRENGTH - Plantar-flexors  Stand with your feet shoulder width apart. Steady yourself with a wall or table  using as little support as needed. Keeping your weight evenly spread over the width of your feet, rise up on your toes.* Hold this position for 10 seconds. Repeat 3 times. Complete this exercise 2 times per day.  *If this is too easy, shift your weight toward your right / left leg until you feel challenged. Ultimately, you may be asked to do this exercise with your right / left foot only.  STRENGTH  Plantar-flexors, Eccentric  Note: This exercise can place a lot of stress on your foot and ankle. Please complete this exercise only if specifically instructed by your caregiver.  Place the balls of your feet on a step. With your hands, use only enough support from a wall or rail to keep your balance. Keep your knees straight and rise up on your toes. Slowly shift your weight entirely to your right / left toes and pick up your opposite foot. Gently and with controlled movement, lower your weight through your right / left foot so that your heel drops below the level of the step. You will feel a slight stretch in the back of your calf at the end position. Use the healthy leg to help rise up onto the balls of both feet, then lower weight only on the right / left leg again. Build up to 15 repetitions. Then progress to 3 consecutive sets of 15 repetitions.* After completing the above exercise, complete the same exercise with a slight knee bend (about 30 degrees). Again, build up to 15 repetitions. Then progress to 3 consecutive sets of 15 repetitions.* Perform this exercise 2 times per day.  *When you easily complete 3 sets of 15, your physician, physical therapist or athletic trainer may advise you to add resistance by wearing a backpack filled with additional weight.  STRENGTH - Plantar Flexors, Seated  Sit on a chair that allows your feet to rest flat on the ground. If necessary, sit at the edge of the chair. Keeping your toes firmly on the ground, lift your right / left heel as far as you can without  increasing any discomfort in your ankle. Repeat 3 times. Complete this exercise 2 times a day.   Look for an "EvenUp" shoe attachment on [employer] or at [employer]. This will level out your hips while you are walking in the CAM boot. Wear this on the other foot around a supportive sneaker:

## 2023-10-02 ENCOUNTER — Encounter: Payer: Self-pay | Admitting: Podiatry

## 2023-10-21 ENCOUNTER — Ambulatory Visit (INDEPENDENT_AMBULATORY_CARE_PROVIDER_SITE_OTHER): Admitting: Podiatry

## 2023-10-21 DIAGNOSIS — Z91199 Patient's noncompliance with other medical treatment and regimen due to unspecified reason: Secondary | ICD-10-CM

## 2023-10-24 NOTE — Progress Notes (Signed)
Patient did not show for scheduled appointment today.

## 2023-10-27 ENCOUNTER — Other Ambulatory Visit: Payer: Self-pay | Admitting: Podiatry

## 2023-10-27 ENCOUNTER — Ambulatory Visit: Payer: Self-pay | Admitting: Family Medicine

## 2023-10-27 DIAGNOSIS — M7661 Achilles tendinitis, right leg: Secondary | ICD-10-CM

## 2023-11-17 ENCOUNTER — Ambulatory Visit: Admitting: Podiatry

## 2023-11-17 ENCOUNTER — Encounter: Payer: Self-pay | Admitting: Podiatry

## 2023-11-17 VITALS — Ht 64.0 in | Wt 298.0 lb

## 2023-11-17 DIAGNOSIS — M7661 Achilles tendinitis, right leg: Secondary | ICD-10-CM | POA: Diagnosis not present

## 2023-11-17 MED ORDER — METHYLPREDNISOLONE 4 MG PO TBPK: ORAL_TABLET | ORAL | 0 refills | Status: DC

## 2023-11-17 NOTE — Progress Notes (Signed)
 Chief Complaint  Patient presents with   Foot Pain    Pt is here to f/u on right achilles tendinitis, she states that she is still in some pain but not as bad as before, continues to wear cam boot while ambulating, does foot stretches and keeps foot elevated while resting, no other complaints.    HPI: 58 y.o. female presents today following up for right insertional Achilles tendinitis.  She has noticed some improvement with the CAM boot, has been wearing croc's at home. Still deals with pretty significant pain and soreness with shoes and with activity.  Past Medical History:  Diagnosis Date   Arthritis    shoulders,Back,feet,top of spine   Depressed    GERD (gastroesophageal reflux disease)    Headache    Hx. of migraines when working   Polymyalgia rheumatica [facility])     Past Surgical History:  Procedure Laterality Date   ABDOMINAL HYSTERECTOMY     anxiety     CHOLECYSTECTOMY     EUS N/A 01/03/2015   Procedure: FULL UPPER ENDOSCOPIC ULTRASOUND (EUS) RADIAL;  Surgeon: Belvie Just, MD;  Location: [facility];  Service: Endoscopy;  Laterality: N/A;    Allergies  Allergen Reactions   Sulfa Antibiotics Hives    ROS denies any nausea, vomiting, fever, chills, chest pain, shortness breath   Physical Exam: There were no vitals filed for this visit.  General: The patient is alert and oriented x3 in no acute distress.  Dermatology: Skin is warm, dry and supple bilateral lower extremities. Interspaces are clear of maceration and debris.    Vascular: Palpable pedal pulses bilaterally. Capillary refill within normal limits.  No appreciable edema.  No erythema or calor.  Neurological: Light touch sensation grossly intact bilateral feet.   Musculoskeletal Exam: Pain on palpation of right Achilles at level of insertion, posterior heel.  No pain on palpation of Achilles tendon watershed area.  No pain on palpation of plantar heel or medial band plantar fascia.  Muscle strength  5/5 for all major muscle groups  Radiographic Exam: Right foot 3 views weightbearing 09/30/2023 Normal osseous mineralization. Joint spaces preserved.  No fractures noted.  There is some spurring present to posterior calcaneal tubercle at level of Achilles insertion.  No calcifications noted within Achilles tendon.  Assessment/Plan of Care: 1. Achilles tendinitis of right lower extremity      Meds ordered this encounter  Medications   methylPREDNISolone  (MEDROL  DOSEPAK) 4 MG TBPK tablet    Sig: 6 Day Tapering Dose    Dispense:  21 tablet    Refill:  0   None  Discussed clinical findings with patient today.  - Medications: Medrol  dosepak prescribed to patient. - Continue CAM boot immobilization for at least 2 weeks. Encouraged more strict use in home. After 2 weeks if she has improvement can gradually increase light activity. - Home PT and stretching regimen discussed with patient, written instructions dispensed.  Okay to come out of cam boot for stretching regimen - RICE therapy as needed - Recommend over-the-counter naproxen  this point once she finishes her meloxicam . - Continue use of heel lifts in the boot.  Recommend over-the-counter padded Achilles tendinitis sleeve when she returns to shoes -Follow up in 4 weeks, may consider PT at this point if still ongoing.   Daundre Biel L. Lamount MAUL, AACFAS [facility]     [address]  [place], [place] [address]                Office [phone]  Fax [phone]

## 2023-11-17 NOTE — Patient Instructions (Signed)

## 2023-11-28 ENCOUNTER — Other Ambulatory Visit: Payer: Self-pay | Admitting: Podiatry

## 2023-11-28 DIAGNOSIS — M7661 Achilles tendinitis, right leg: Secondary | ICD-10-CM

## 2023-12-06 ENCOUNTER — Ambulatory Visit: Attending: Cardiology | Admitting: Cardiology

## 2023-12-06 ENCOUNTER — Encounter: Payer: Self-pay | Admitting: Cardiology

## 2023-12-06 VITALS — BP 130/76 | HR 86 | Ht 66.0 in | Wt 302.8 lb

## 2023-12-06 DIAGNOSIS — R002 Palpitations: Secondary | ICD-10-CM

## 2023-12-06 NOTE — Patient Instructions (Signed)
Medication Instructions:  ?Your physician recommends that you continue on your current medications as directed. Please refer to the Current Medication list given to you today. ? ? ?Labwork: ?None today ? ?Testing/Procedures: ?None today ? ?Follow-Up: ?6 weeks ? ?Any Other Special Instructions Will Be Listed Below (If Applicable). ? ?If you need a refill on your cardiac medications before your next appointment, please call your pharmacy. ? ?

## 2023-12-06 NOTE — Progress Notes (Signed)
 Clinical Summary Hannah Lin is a 58 y.o.female seen today as a new consult, referred by PA Hannah Lin for the following medical problems.  Referral mentions chest pains but on history patient more specifically describes palpitations, denies specific chest pains.   1.Palpitations - feeling of heart beating irregular, and few hard beats. Can occur at rest or with activity. Lasts a few seconds. No other associated symptoms. Reports about 5-6 episodes over 6 months. Last episode was yesterday - Lin recently protein coffee x 1, limited tea, pepsi zero x 2 glasses, no energy drinks. No EtOH   Past Medical History:  Diagnosis Date   Arthritis    shoulders,Back,feet,top of spine   Depressed    GERD (gastroesophageal reflux disease)    Headache    Hx. of migraines when working   Polymyalgia rheumatica ([facility])      Allergies  Allergen Reactions   Sulfa Antibiotics Hives     Current Outpatient Medications  Medication Sig Dispense Refill   amphetamine -dextroamphetamine  (ADDERALL) 30 MG tablet Take 30 mg by mouth 2 (two) times daily.     Biotin w/ Vitamins C & E (HAIR SKIN & NAILS GUMMIES) 1250-7.5-7.5 MCG-MG-UNT CHEW Chew 2 tablets by mouth every morning.     buPROPion  (WELLBUTRIN  XL) 150 MG 24 hr tablet Take 150 mg by mouth every evening.     citalopram  (CELEXA ) 40 MG tablet Take 40 mg by mouth every evening.     clonazePAM  (KLONOPIN ) 1 MG tablet Take 1 mg by mouth 2 (two) times daily as needed for anxiety.     cyclobenzaprine  (FLEXERIL ) 10 MG tablet Take 1 tablet (10 mg total) by mouth 2 (two) times daily as needed for muscle spasms. 20 tablet 0   EPINEPHrine 0.3 mg/0.3 mL IJ SOAJ injection Inject 0.3 mg into the muscle once as needed for anaphylaxis.     HYDROcodone -acetaminophen  (NORCO/VICODIN) 5-325 MG tablet Take 1 tablet by mouth every 6 (six) hours as needed. 6 tablet 0   meloxicam  (MOBIC ) 15 MG tablet TAKE 1 TABLET (15 MG TOTAL) BY MOUTH DAILY. 30 tablet 0   methylPREDNISolone   (MEDROL  DOSEPAK) 4 MG TBPK tablet 6 Day Tapering Dose 21 tablet 0   omeprazole (PRILOSEC) 20 MG capsule Take 20 mg by mouth daily as needed (indigestion).      oxyCODONE -acetaminophen  (PERCOCET/ROXICET) 5-325 MG tablet Take 1 tablet by mouth every 6 (six) hours as needed for severe pain (pain score 7-10). 8 tablet 0   oxyCODONE -acetaminophen  (ROXICET) 5-325 MG per tablet Take 1 tablet by mouth every 4 (four) hours as needed for severe pain. (Patient not taking: Reported on 11/17/2023) 14 tablet 0   triamcinolone cream (KENALOG) 0.1 % Apply 1 application topically See admin instructions. Two to three times a day to affected areas     valACYclovir (VALTREX) 1000 MG tablet Take 1,000 mg by mouth every 12 (twelve) hours as needed. For fever blister flares     No current facility-administered medications for this visit.     Past Surgical History:  Procedure Laterality Date   ABDOMINAL HYSTERECTOMY     anxiety     CHOLECYSTECTOMY     EUS N/A 01/03/2015   Procedure: FULL UPPER ENDOSCOPIC ULTRASOUND (EUS) RADIAL;  Surgeon: Hannah Just, MD;  Location: [facility];  Service: Endoscopy;  Laterality: N/A;     Allergies  Allergen Reactions   Sulfa Antibiotics Hives      Family History  Problem Relation Age of Onset   Pancreatitis Neg Hx  Social History Hannah Lin reports that she has never smoked. She has never used smokeless tobacco. Hannah Lin reports no history of alcohol use.     Physical Examination Today's Vitals   12/06/23 1027  BP: 130/76  Pulse: 86  SpO2: 96%  Weight: (!) 302 lb 12.8 oz (137.3 kg)  Height: 5' 6 (1.676 m)  PainSc: 0-No pain   Body mass index is 48.87 kg/m.  Gen: resting comfortably, no acute distress HEENT: no scleral icterus, pupils equal round and reactive, no palptable cervical adenopathy,  CV: RRR, no mrg, no jvd Resp: Clear to auscultation bilaterally GI: abdomen is soft, non-tender, non-distended, normal bowel sounds, no  hepatosplenomegaly MSK: extremities are warm, no edema.  Skin: warm, no rash Neuro:  no focal deficits Psych: appropriate affect    Assessment and Plan  1.Palpitations - EKG today shows NSR - fairly mild symptoms. Discussed weaning caffeine and monitoring, if ongoing could consider a monitor at that point - high stress, notes mention generalized anxiety as well. May also be a strong driver of her symptoms   F/u 6 weeks.       Hannah Lin, M.D.

## 2023-12-15 ENCOUNTER — Ambulatory Visit: Admitting: Podiatry

## 2023-12-27 ENCOUNTER — Ambulatory Visit: Admitting: Internal Medicine

## 2023-12-31 ENCOUNTER — Other Ambulatory Visit: Payer: Self-pay | Admitting: Podiatry

## 2023-12-31 DIAGNOSIS — M7661 Achilles tendinitis, right leg: Secondary | ICD-10-CM

## 2024-01-05 ENCOUNTER — Ambulatory Visit: Admitting: Podiatry

## 2024-01-18 ENCOUNTER — Ambulatory Visit: Admitting: Diagnostic Neuroimaging

## 2024-01-18 ENCOUNTER — Encounter: Payer: Self-pay | Admitting: Diagnostic Neuroimaging

## 2024-01-18 VITALS — BP 141/91 | HR 78 | Ht 67.0 in | Wt 306.0 lb

## 2024-01-18 DIAGNOSIS — R413 Other amnesia: Secondary | ICD-10-CM

## 2024-01-18 NOTE — Progress Notes (Signed)
 [facility]  PATIENT: Hannah Lin DOB: [birth year]  REFERRING CLINICIAN: Lynwood Laneta ORN, PA-C HISTORY FROM: patient  REASON FOR VISIT: new consult   HISTORICAL  CHIEF COMPLAINT:  Chief Complaint  Patient presents with   Memory Loss    Rm6  alone Pt is well, reports she remembers having memory concerns since her 30s. Symptoms have been persistent and worsen overtime. She has trouble with remember bills, appointments and recent events that happened. No known family history of memory disorders.     HISTORY OF PRESENT ILLNESS:   58 year old female here for evaluation of memory and cognitive difficulty.  History of mild memory and cognitive difficulty as far back as age 49 years old.  In 2017 had evaluation by a neurologist.  Had MRI of the brain at that time.  We do not have the records of this evaluation.  Patient has had some chronic insomnia issues, snoring issues, stress and anxiety issues as well.  Sleep is quite limited and sometimes as little as 2 to 3 hours/day, typically from 5 to 8 AM.   REVIEW OF SYSTEMS: Full 14 system review of systems performed and negative with exception of: As per HPI.  ALLERGIES: Allergies  Allergen Reactions   Sulfa Antibiotics Hives   Fluoxetine Other (See Comments)    fluoxetine   Gabapentin  Swelling    HOME MEDICATIONS: Outpatient Medications Prior to Visit  Medication Sig Dispense Refill   buPROPion  (WELLBUTRIN  XL) 150 MG 24 hr tablet Take 150 mg by mouth every evening.     EPINEPHrine 0.3 mg/0.3 mL IJ SOAJ injection Inject 0.3 mg into the muscle once as needed for anaphylaxis.     Ergocalciferol (VITAMIN D2 PO)      meloxicam  (MOBIC ) 15 MG tablet TAKE 1 TABLET (15 MG TOTAL) BY MOUTH DAILY. 30 tablet 0   omeprazole (PRILOSEC) 20 MG capsule Take 20 mg by mouth daily as needed (indigestion).      sertraline (ZOLOFT) 100 MG tablet Take 100 mg by mouth daily.     traZODone (DESYREL) 50 MG tablet Take 50 mg by  mouth at bedtime.     triamcinolone cream (KENALOG) 0.1 % Apply 1 application topically See admin instructions. Two to three times a day to affected areas     valACYclovir (VALTREX) 1000 MG tablet Take 1,000 mg by mouth every 12 (twelve) hours as needed. For fever blister flares     clonazePAM  (KLONOPIN ) 1 MG tablet Take 1 mg by mouth 2 (two) times daily as needed for anxiety.     No facility-administered medications prior to visit.    PAST MEDICAL HISTORY: Past Medical History:  Diagnosis Date   Arthritis    shoulders,Back,feet,top of spine   Depressed    GERD (gastroesophageal reflux disease)    Headache    Hx. of migraines when working   Polymyalgia rheumatica (HCC)     PAST SURGICAL HISTORY: Past Surgical History:  Procedure Laterality Date   ABDOMINAL HYSTERECTOMY     anxiety     CHOLECYSTECTOMY     EUS N/A 01/03/2015   Procedure: FULL UPPER ENDOSCOPIC ULTRASOUND (EUS) RADIAL;  Surgeon: Belvie Just, MD;  Location: [facility];  Service: Endoscopy;  Laterality: N/A;    FAMILY HISTORY: Family History  Problem Relation Age of Onset   Pancreatitis Neg Hx     SOCIAL HISTORY: Social History   Socioeconomic History   Marital status: Married    Spouse name: Not on file   Number of  children: Not on file   Years of education: Not on file   Highest education level: Not on file  Occupational History   Not on file  Tobacco Use   Smoking status: Never   Smokeless tobacco: Never  Vaping Use   Vaping status: Never Used  Substance and Sexual Activity   Alcohol use: No   Drug use: No   Sexual activity: Not on file  Other Topics Concern   Not on file  Social History Narrative   Not on file   Social Drivers of Health   Financial Resource Strain: Not on file  Food Insecurity: Low Risk  (07/27/2023)   Received from [facility]   Hunger Vital Sign    Within the past 12 months, you worried that your food would run out before you got money to buy more: Never true     Within the past 12 months, the food you bought just didn't last and you didn't have money to get more. : Never true  Transportation Needs: No Transportation Needs (07/27/2023)   Received from [facility]    In the past 12 months, has lack of reliable transportation kept you from medical appointments, meetings, work or from getting things needed for daily living? : No  Physical Activity: Not on file  Stress: Not on file  Social Connections: Not on file  Intimate Partner Violence: Not on file     PHYSICAL EXAM  GENERAL EXAM/CONSTITUTIONAL: Vitals:  Vitals:   01/18/24 1044  BP: (!) 141/91  Pulse: 78  Weight: (!) 306 lb (138.8 kg)  Height: 5' 7 (1.702 m)   Body mass index is 47.93 kg/m. Wt Readings from Last 3 Encounters:  01/18/24 (!) 306 lb (138.8 kg)  12/06/23 (!) 302 lb 12.8 oz (137.3 kg)  11/17/23 298 lb (135.2 kg)   Patient is in no distress; well developed, nourished and groomed; neck is supple  CARDIOVASCULAR: Examination of carotid arteries is normal; no carotid bruits Regular rate and rhythm, no murmurs Examination of peripheral vascular system by observation and palpation is normal  EYES: Ophthalmoscopic exam of optic discs and posterior segments is normal; no papilledema or hemorrhages No results found.  MUSCULOSKELETAL: Gait, strength, tone, movements noted in Neurologic exam below  NEUROLOGIC: MENTAL STATUS:      01/18/2024   10:50 AM  Montreal Cognitive Assessment   Visuospatial/ Executive (0/5) 5  Naming (0/3) 3  Attention: Read list of digits (0/2) 2  Attention: Read list of letters (0/1) 1  Attention: Serial 7 subtraction starting at 100 (0/3) 3  Language: Repeat phrase (0/2) 2  Language : Fluency (0/1) 1  Abstraction (0/2) 2  Delayed Recall (0/5) 1  Orientation (0/6) 6  Total 26   awake, alert, oriented to person, place and time recent and remote memory intact normal attention and concentration language fluent,  comprehension intact, naming intact fund of knowledge appropriate  CRANIAL NERVE:  2nd - no papilledema on fundoscopic exam 2nd, 3rd, 4th, 6th - pupils equal and reactive to light, visual fields full to confrontation, extraocular muscles intact, no nystagmus 5th - facial sensation symmetric 7th - facial strength symmetric 8th - hearing intact 9th - palate elevates symmetrically, uvula midline 11th - shoulder shrug symmetric 12th - tongue protrusion midline  MOTOR:  normal bulk and tone, full strength in the BUE, BLE  SENSORY:  normal and symmetric to light touch, temperature, vibration  COORDINATION:  finger-nose-finger, fine finger movements normal  REFLEXES:  deep  tendon reflexes 1+ and symmetric  GAIT/STATION:  narrow based gait     DIAGNOSTIC DATA (LABS, IMAGING, TESTING) - I reviewed patient records, labs, notes, testing and imaging myself where available.  Lab Results  Component Value Date   WBC 8.6 05/08/2023   HGB 12.3 05/08/2023   HCT 39.3 05/08/2023   MCV 87.1 05/08/2023   PLT 304 05/08/2023      Component Value Date/Time   NA 137 05/08/2023 1535   K 3.8 05/08/2023 1535   CL 105 05/08/2023 1535   CO2 23 05/08/2023 1535   GLUCOSE 111 (H) 05/08/2023 1535   BUN 19 05/08/2023 1535   CREATININE 0.71 05/08/2023 1535   CALCIUM 8.8 (L) 05/08/2023 1535   PROT 7.8 05/08/2023 1535   ALBUMIN 3.7 05/08/2023 1535   AST 25 05/08/2023 1535   ALT 29 05/08/2023 1535   ALKPHOS 60 05/08/2023 1535   BILITOT 0.5 05/08/2023 1535   GFRNONAA >60 05/08/2023 1535   GFRAA >60 02/09/2016 1714   Lab Results  Component Value Date   CHOL 232 (H) 10/17/2014   HDL 65 10/17/2014   LDLCALC 154 (H) 10/17/2014   TRIG 64 10/17/2014   CHOLHDL 3.6 10/17/2014   No results found for: HGBA1C No results found for: VITAMINB12 No results found for: TSH  Component Ref Range & Units 4 mo ago  Vitamin B-12 180 - 914 pg/mL 1,080 High    03/12/16 MRI brain [I reviewed images  myself . No major atrophy. . -VRP]  1. Mild age-related cerebral atrophy with chronic microvascular ischemic disease. No acute or reversible intracranial process identified. 2. Empty sella. While this is often an incidental finding of no clinical significance, this can also be seen in the setting of elevated intracranial pressures (idiopathic intracranial hypertension). 3. Right mastoid effusion, likely benign/sterile. Clinical correlation for possible mastoiditis recommended.   ASSESSMENT AND PLAN  58 y.o. year old female here with:  Dx:  1. Memory loss     PLAN:  mild memory complaints (no changes in ADLs, MOCA 26/30) - check MRI brain  Insomnia / stress / anxiety - consider sleep psychology evaluation  Migraine with aura (rare) - ibuprofen , tylenol  as needed  Orders Placed This Encounter  Procedures   MR BRAIN W WO CONTRAST   Return for pending test results, pending if symptoms worsen or fail to improve.    EDUARD FABIENE HANLON, MD 01/18/2024, 11:28 AM Certified in Neurology, Neurophysiology and Neuroimaging  [facility] [address], [address] [place], [place] [address] [phone]

## 2024-01-18 NOTE — Patient Instructions (Addendum)
 mild memory complaints (no changes in ADLs, MOCA 26/30) - check MRI brain  Insomnia / stress / anxiety - consider sleep psychology evaluation  Migraine with aura (rare) - ibuprofen , tylenol  as needed

## 2024-01-23 NOTE — Progress Notes (Unsigned)
   Cardiology Office Note    Date:  01/24/2024  ID:  Hannah, Lin [birth year], MRN [id number] Cardiologist: Alvan Carrier, MD Cardiology APP:  Hannah Laymon HERO, PA-C { :  History of Present Illness:    Hannah Lin is a 58 y.o. female with past medical history of palpitations, arthritis, GERD and PMR who presents to the office today for 6-week follow-up.  She was examined by Dr. Alvan in 12/2023 as a new patient referral for palpitations which would last for a few seconds and spontaneously resolve. She reported approximately 5-6 episodes over the past 6 months. EKG showed normal sinus rhythm and at that time, it was recommended to reduce caffeine intake and if ongoing symptoms, could consider a monitor at follow-up.  In talking with the patient today, she denies any specific tachy-palpitations but reports developing a throbbing sensation along her upper back which radiates into her chest at times and can last for a minute and then resolves. Reports she had overall been doing well but had 3 episodes this past Saturday. Unaware of any specific triggers. Reports consuming 1-2 caffeinated sodas a day but no regular coffee or tea consumption. Has been under increased stress. Her overall activity is limited due to back pain and she recently received an epidural injection to help with this. She was previously trying to walk a mile a day for exercise but had to stop due to her worsening back pain and a bone spur along her foot. She denies any specific orthopnea, PND or pitting edema.  Studies Reviewed:   EKG:  EKG is not ordered today. EKG from 12/06/2023 is reviewed and shows normal sinus rhythm heart rate 85 with no acute ST changes.  Venous Doppler: 02/2023 IMPRESSION: 1. No evidence of deep venous thrombosis within the left lower extremity.   Physical Exam:   VS:  BP 128/72   Pulse 80   Ht 5' 7 (1.702 m)   Wt (!) 309 lb 12.8 oz (140.5 kg)   SpO2 96%   BMI 48.52 kg/m    Wt  Readings from Last 3 Encounters:  01/24/24 (!) 309 lb 12.8 oz (140.5 kg)  01/18/24 (!) 306 lb (138.8 kg)  12/06/23 (!) 302 lb 12.8 oz (137.3 kg)     GEN: Well nourished, well developed female appearing in no acute distress NECK: No JVD; No carotid bruits CARDIAC: RRR, no murmurs, rubs, gallops RESPIRATORY:  Clear to auscultation without rales, wheezing or rhonchi  ABDOMEN: Appears non-distended. No obvious abdominal masses. EXTREMITIES: No clubbing or cyanosis. No pitting edema.  Distal pedal pulses are 2+ bilaterally.   Assessment and Plan:   1. Palpitations - It was previously thought that her throbbing sensations represented palpitations and she does report having recurrent episodes since her last office visit. Reviewed options with the patient and will plan for a 2-week Zio patch for assessment of any significant arrhythmias. Encouraged her to continue to reduce caffeine intake.  2. Elevated BP without diagnosis of HTN - BP was initially elevated at 158/91, rechecked personally and improved to 128/72. She did report having to walk a longer distance to the office today and had experienced worsening back pain. Would continue to follow in the ambulatory setting. Not currently on antihypertensive medications. She does consume a lot of frozen meals and the importance of reducing sodium intake was reviewed.  Signed, Laymon Lin Johnson, PA-C

## 2024-01-24 ENCOUNTER — Ambulatory Visit: Attending: Student

## 2024-01-24 ENCOUNTER — Ambulatory Visit: Attending: Student | Admitting: Student

## 2024-01-24 ENCOUNTER — Encounter: Payer: Self-pay | Admitting: Student

## 2024-01-24 VITALS — BP 128/72 | HR 80 | Ht 67.0 in | Wt 309.8 lb

## 2024-01-24 DIAGNOSIS — R030 Elevated blood-pressure reading, without diagnosis of hypertension: Secondary | ICD-10-CM

## 2024-01-24 DIAGNOSIS — R002 Palpitations: Secondary | ICD-10-CM

## 2024-01-24 NOTE — Patient Instructions (Signed)
 Medication Instructions:  Your physician recommends that you continue on your current medications as directed. Please refer to the Current Medication list given to you today.  *If you need a refill on your cardiac medications before your next appointment, please call your pharmacy*  Lab Work: NONE   If you have labs (blood work) drawn today and your tests are completely normal, you will receive your results only by: MyChart Message (if you have MyChart) OR A paper copy in the mail If you have any lab test that is abnormal or we need to change your treatment, we will call you to review the results.  Testing/Procedures: ZIO XT- Long Term Monitor Instructions   Your physician has requested you wear your ZIO patch monitor___14____days. You may remove on 02/07/24.   This is a single patch monitor.  [employer] supplies one patch monitor per enrollment.  Additional stickers are not available.   Please do not apply patch if you will be having a Nuclear Stress Test, Echocardiogram, Cardiac CT, MRI, or Chest Xray during the time frame you would be wearing the monitor. The patch cannot be worn during these tests.  You cannot remove and re-apply the ZIO XT patch monitor.   Do not shower for the first 24 hours.  You may shower after the first 24 hours.   Press button if you feel a symptom. You will hear a small click.  Record Date, Time and Symptom in the Patient Log Book.   When you are ready to remove patch, follow instructions on last 2 pages of Patient Log Book.  Stick patch monitor onto last page of Patient Log Book.   Place Patient Log Book in [place] box.  Use locking tab on box and tape box closed securely.  The [employer] has [employer] on it.  Please place in mailbox as soon as possible.  Your physician should have your test results approximately 7 days after the monitor has been mailed back to [facility].   Call [facility] Customer Care at [phone] if you have  questions regarding your ZIO XT patch monitor.  Call them immediately if you see an orange light blinking on your monitor.   If your monitor falls off in less than 4 days contact our Monitor department at [phone].  If your monitor becomes loose or falls off after 4 days call [employer] at [phone] for suggestions on securing your monitor.    Follow-Up: At [facility], you and your health needs are our priority.  As part of our continuing mission to provide you with exceptional heart care, our providers are all part of one team.  This team includes your primary Cardiologist (physician) and Advanced Practice Providers or APPs (Physician Assistants and Nurse Practitioners) who all work together to provide you with the care you need, when you need it.  Your next appointment:   2 month(s)  Provider:   Laymon Qua, PA-C    We recommend signing up for the patient portal called MyChart.  Sign up information is provided on this After Visit Summary.  MyChart is used to connect with patients for Virtual Visits (Telemedicine).  Patients are able to view lab/test results, encounter notes, upcoming appointments, etc.  Non-urgent messages can be sent to your provider as well.   To learn more about what you can do with MyChart, go to [email or web address].   Other Instructions Thank you for choosing [facility]!

## 2024-02-03 ENCOUNTER — Other Ambulatory Visit: Payer: Self-pay | Admitting: Podiatry

## 2024-02-03 DIAGNOSIS — M7661 Achilles tendinitis, right leg: Secondary | ICD-10-CM

## 2024-03-11 ENCOUNTER — Other Ambulatory Visit: Payer: Self-pay | Admitting: Podiatry

## 2024-03-11 DIAGNOSIS — M7661 Achilles tendinitis, right leg: Secondary | ICD-10-CM

## 2024-03-26 ENCOUNTER — Ambulatory Visit: Admitting: Student

## 2024-03-26 NOTE — Progress Notes (Deleted)
   Cardiology Office Note    Date:  03/26/2024  ID:  Alayha, Babineaux 1965-07-26, MRN [id number] Cardiologist: Alvan Carrier, MD Cardiology APP:  Johnson Laymon HERO, PA-C { :  History of Present Illness:    Hannah Lin is a 58 y.o. female with past medical history of palpitations, arthritis, GERD and PMR who presents to the office today for 36-month follow-up.  She was examined by myself in 01/2024 and reported occasional throbbing sensations along her upper back which would radiate into her chest at times.  She was consuming several caffeinated sodas a day and reported being under increased stress.  A 2-week Zio patch was recommended for further assessment.  The preliminary report of this shows predominantly normal sinus rhythm with an average heart rate of 78 bpm.  She did have 2 episodes of SVT with the longest lasting for 12 beats. She had rare PAC's and PVC's but less than 1% burden.  ROS: ***  Studies Reviewed:   EKG: EKG is*** ordered today and demonstrates ***   EKG Interpretation Date/Time:    Ventricular Rate:    PR Interval:    QRS Duration:    QT Interval:    QTC Calculation:   R Axis:      Text Interpretation:         Event Monitor: 02/2024 Patch Wear Time:  13 days and 6 hours (2025-09-23T08:48:43-0400 to 2025-10-06T14:58:30-0400)   Patient had a min HR of 46 bpm, max HR of 158 bpm, and avg HR of 78 bpm. Predominant underlying rhythm was Sinus Rhythm. 2 Supraventricular Tachycardia runs occurred, the run with the fastest interval lasting 6 beats with a max rate of 158 bpm, the  longest lasting 12 beats with an avg rate of 137 bpm. Isolated SVEs were rare (<1.0%), SVE Couplets were rare (<1.0%), and no SVE Triplets were present. Isolated VEs were rare (<1.0%), and no VE Couplets or VE Triplets were present.    Risk Assessment/Calculations:   {Does this patient have ATRIAL FIBRILLATION?:[id number]} No BP recorded.  {Refresh Note OR Click here to enter BP   :1}***         Physical Exam:   VS:  There were no vitals taken for this visit.   Wt Readings from Last 3 Encounters:  01/24/24 (!) 309 lb 12.8 oz (140.5 kg)  01/18/24 (!) 306 lb (138.8 kg)  12/06/23 (!) 302 lb 12.8 oz (137.3 kg)     GEN: Well nourished, well developed in no acute distress NECK: No JVD; No carotid bruits CARDIAC: ***RRR, no murmurs, rubs, gallops RESPIRATORY:  Clear to auscultation without rales, wheezing or rhonchi  ABDOMEN: Appears non-distended. No obvious abdominal masses. EXTREMITIES: No clubbing or cyanosis. No edema.  Distal pedal pulses are 2+ bilaterally.   Assessment and Plan:   1. Palpitations ***   Signed, Laymon HERO Johnson, PA-C

## 2024-03-28 NOTE — Telephone Encounter (Signed)
Form is at front desk for patient to pick up.

## 2024-04-02 ENCOUNTER — Ambulatory Visit: Payer: Self-pay | Admitting: Student

## 2024-04-02 DIAGNOSIS — R002 Palpitations: Secondary | ICD-10-CM

## 2024-04-08 ENCOUNTER — Other Ambulatory Visit: Payer: Self-pay | Admitting: Podiatry

## 2024-04-08 DIAGNOSIS — M7661 Achilles tendinitis, right leg: Secondary | ICD-10-CM

## 2024-04-20 ENCOUNTER — Telehealth: Payer: Self-pay | Admitting: Podiatry

## 2024-04-20 NOTE — Telephone Encounter (Signed)
 Patient called in wanting to know if she can get another CAM boot for her foot pain.

## 2024-05-03 NOTE — Progress Notes (Deleted)
 " Cardiology Office Note:  .   Date:  05/03/2024  ID:  Hannah Lin, DOB 12-08-1965, MRN [id number] PCP: Lynwood Laneta LELON DEVONNA  [facility] Providers Cardiologist:  Alvan Carrier, MD Cardiology APP:  Johnson Laymon HERO, PA-C {  History of Present Illness: .   Hannah Lin is a 59 y.o. female  with PMHx of palpitations, arthritis, GERD, milligram who reports to [facility] office for follow up.   Pertinent cardiac medical history:  Palpitations Evaluated by Dr. Alvan 12/2023 as new patient referral for palpitations that lasted for few seconds and resolved spontaneously.  Recommended to reduce caffeine intake. Heart monitor 01/2024: Predominantly NSR with average HR of 78, 2 brief runs of SVT with longest lasting 12 beats, rare PACs/PVCs  Last seen in heartcare 01/2024 by [name], PA-C for follow up.  Reported recurrent palpitations.  Ordered follow-up heart monitor as above.  Also noted initial elevated BP of 158/91 which improved to 128/72.  Patient contributed to walking longer distance prior to office visit and experiencing worsening back pain.  Not started doing any antihypertensive medications.  Discussed reducing sodium consumption.  Not on any cardiac medications.  Recent hospitalization   Today, reports ### and denies ###.  Denies chest pain, shortness of breath, palpitations, syncope, presyncope, dizziness, orthopnea, PND, swelling or significant weight changes, acute bleeding, or claudication.   Reports compliance with medications.  Dietary habitats:  Activity level:  Social: Denies tobacco use/alcohol/drug use  Denies any recent hospitalizations or visits to the emergency department.   Palpitations Encouraged to continue to reduce caffeine intake.  Elevated BP without diagnosis of hypertension Reports well controlled Home BP:  BP this OV well controlled today:  Continue on / Managed by GDMT as above.  Encourage physical activity for 150 minutes per week  and heart healthy low sodium diet. Discussed limiting sodium intake to < 2 grams daily.     ROS: 10 point review of system has been reviewed and considered negative except ones been listed in the HPI.   Studies Reviewed: .   Heart Monitor 01/2024   13 day monitor   Rare supraventricular ectopy in the form of isolated PACs, couplets. 2 runs of SVT longest 12 beats.   Rare ventricular ectopy in the form of isolated PVCs   No symptoms reported     Patch Wear Time:  13 days and 6 hours (2025-09-23T08:48:43-0400 to 2025-10-06T14:58:30-0400)   Patient had a min HR of 46 bpm, max HR of 158 bpm, and avg HR of 78 bpm. Predominant underlying rhythm was Sinus Rhythm. 2 Supraventricular Tachycardia runs occurred, the run with the fastest interval lasting 6 beats with a max rate of 158 bpm, the  longest lasting 12 beats with an avg rate of 137 bpm. Isolated SVEs were rare (<1.0%), SVE Couplets were rare (<1.0%), and no SVE Triplets were present. Isolated VEs were rare (<1.0%), and no VE Couplets or VE Triplets were present.  CV Studies: Cardiac studies reviewed are outlined and summarized above. Otherwise please see EMR for full report.   Risk Assessment/Calculations:   {Does this patient have ATRIAL FIBRILLATION?:[phone]} No BP recorded.  {Refresh Note OR Click here to enter BP  :1}***       Physical Exam:   VS:  There were no vitals taken for this visit.   Wt Readings from Last 3 Encounters:  01/24/24 (!) 309 lb 12.8 oz (140.5 kg)  01/18/24 (!) 306 lb (138.8 kg)  12/06/23 (!) 302 lb 12.8  oz (137.3 kg)    GEN: Well nourished, well developed in no acute distress while sitting in chair.  NECK: No JVD; No carotid bruits CARDIAC: ***RRR, no murmurs, rubs, gallops RESPIRATORY:  Clear to auscultation without rales, wheezing or rhonchi  ABDOMEN: Soft, non-tender, non-distended EXTREMITIES:  No edema; No deformity   ASSESSMENT AND PLAN: .   ***    {Are you ordering a CV Procedure (e.g. stress  test, cath, DCCV, TEE, etc)?   Press F2        :[id number]}  Dispo: ***  Signed, [name], PA-C  "

## 2024-05-04 ENCOUNTER — Ambulatory Visit: Attending: Physician Assistant | Admitting: Physician Assistant

## 2024-05-04 DIAGNOSIS — R030 Elevated blood-pressure reading, without diagnosis of hypertension: Secondary | ICD-10-CM

## 2024-05-04 DIAGNOSIS — R002 Palpitations: Secondary | ICD-10-CM

## 2024-05-08 ENCOUNTER — Other Ambulatory Visit: Payer: Self-pay | Admitting: Podiatry

## 2024-05-08 DIAGNOSIS — M7661 Achilles tendinitis, right leg: Secondary | ICD-10-CM

## 2024-05-14 ENCOUNTER — Ambulatory Visit: Admitting: Podiatry

## 2024-05-30 ENCOUNTER — Emergency Department (HOSPITAL_BASED_OUTPATIENT_CLINIC_OR_DEPARTMENT_OTHER)
Admission: EM | Admit: 2024-05-30 | Discharge: 2024-05-30 | Disposition: A | Discharge status: Home / Self Care | Attending: Emergency Medicine | Admitting: Emergency Medicine

## 2024-05-30 ENCOUNTER — Encounter (HOSPITAL_BASED_OUTPATIENT_CLINIC_OR_DEPARTMENT_OTHER): Payer: Self-pay | Admitting: Emergency Medicine

## 2024-05-30 ENCOUNTER — Other Ambulatory Visit: Payer: Self-pay

## 2024-05-30 ENCOUNTER — Emergency Department (HOSPITAL_BASED_OUTPATIENT_CLINIC_OR_DEPARTMENT_OTHER)

## 2024-05-30 DIAGNOSIS — M7989 Other specified soft tissue disorders: Secondary | ICD-10-CM | POA: Diagnosis present

## 2024-05-30 DIAGNOSIS — H43393 Other vitreous opacities, bilateral: Secondary | ICD-10-CM | POA: Diagnosis not present

## 2024-05-30 NOTE — ED Triage Notes (Signed)
 Reports left lower leg swelling x months Denies any injury to area.

## 2024-05-30 NOTE — ED Provider Notes (Signed)
 " [place] EMERGENCY DEPARTMENT AT [facility] Provider Note   CSN: [id number] Arrival date & time: 05/30/24  9172     Patient presents with: Leg Swelling   Hannah Lin is a 59 y.o. female.   Pt is a 59  yo female with pmhx significant for polymyalgia rheumatica, depression, GERD, and arthritis.  Pt has had left leg swelling for a few months.  She denies any injury.  She has been wearing a boot to her right leg because of some heel spurs and achilles tendonitis.  She thinks it is a little more swollen because she's been using her left leg more.  She was worried about DVT.  No cp.  No sob.  Pt also notes that she's been having floaters in both eyes for months.  They were worse a few days ago, but the bad floaters resolved.         Prior to Admission medications  Medication Sig Start Date End Date Taking? Authorizing Provider  buPROPion  (WELLBUTRIN  XL) 150 MG 24 hr tablet Take 150 mg by mouth every evening.    [provider]  EPINEPHrine 0.3 mg/0.3 mL IJ SOAJ injection Inject 0.3 mg into the muscle once as needed for anaphylaxis.    [provider]  Ergocalciferol (VITAMIN D2 PO)     [provider]  meloxicam  (MOBIC ) 15 MG tablet TAKE 1 TABLET (15 MG TOTAL) BY MOUTH DAILY. 05/08/24   Lamount Ethan CROME, DPM  omeprazole (PRILOSEC) 20 MG capsule Take 20 mg by mouth daily as needed (indigestion).     [provider]  sertraline (ZOLOFT) 100 MG tablet Take 100 mg by mouth daily.    [provider]  traZODone (DESYREL) 50 MG tablet Take 50 mg by mouth at bedtime. 11/10/23   [provider]  triamcinolone cream (KENALOG) 0.1 % Apply 1 application topically See admin instructions. Two to three times a day to affected areas    [provider]  valACYclovir (VALTREX) 1000 MG tablet Take 1,000 mg by mouth every 12 (twelve) hours as needed. For fever blister flares 06/17/15   [provider]    Allergies: Sulfa  antibiotics, Fluoxetine, and Gabapentin     Review of Systems  Musculoskeletal:        Left leg swelling  All other systems reviewed and are negative.   Updated Vital Signs BP (!) 118/92 (BP Location: Right Arm)   Pulse 82   Temp 98.4 F (36.9 C) (Oral)   Resp 18   SpO2 97%   Physical Exam Vitals and nursing note reviewed.  Constitutional:      Appearance: Normal appearance. She is obese.  HENT:     Head: Normocephalic and atraumatic.     Right Ear: External ear normal.     Left Ear: External ear normal.     Nose: Nose normal.     Mouth/Throat:     Mouth: Mucous membranes are moist.     Pharynx: Oropharynx is clear.  Eyes:     Extraocular Movements: Extraocular movements intact.     Conjunctiva/sclera: Conjunctivae normal.     Pupils: Pupils are equal, round, and reactive to light.  Cardiovascular:     Rate and Rhythm: Normal rate and regular rhythm.     Pulses: Normal pulses.     Heart sounds: Normal heart sounds.  Pulmonary:     Effort: Pulmonary effort is normal.     Breath sounds: Normal breath sounds.  Abdominal:  General: Abdomen is flat. Bowel sounds are normal.     Palpations: Abdomen is soft.  Musculoskeletal:        General: Normal range of motion.     Cervical back: Normal range of motion and neck supple.     Comments: Minimal left leg swelling compared to the right  Skin:    General: Skin is warm.     Capillary Refill: Capillary refill takes less than 2 seconds.  Neurological:     General: No focal deficit present.     Mental Status: She is alert and oriented to person, place, and time.  Psychiatric:        Mood and Affect: Mood normal.        Behavior: Behavior normal.     (all labs ordered are listed, but only abnormal results are displayed) Labs Reviewed - No data to display  EKG: None  Radiology: US  Venous Img Lower Unilateral Left Result Date: 05/30/2024 CLINICAL DATA:  Left lower extremity edema EXAM: LEFT LOWER EXTREMITY VENOUS  DOPPLER ULTRASOUND TECHNIQUE: Gray-scale sonography with compression, as well as color and duplex ultrasound, were performed to evaluate the deep venous system(s) from the level of the common femoral vein through the popliteal and proximal calf veins. COMPARISON:  None Available. FINDINGS: VENOUS Normal compressibility of the common femoral, superficial femoral, and popliteal veins, as well as the visualized calf veins. Visualized portions of profunda femoral vein and great saphenous vein unremarkable. No filling defects to suggest DVT on grayscale or color Doppler imaging. Doppler waveforms show normal direction of venous flow, normal respiratory plasticity and response to augmentation. Limited views of the contralateral common femoral vein are unremarkable. OTHER None. Limitations: none IMPRESSION: Negative. Electronically Signed   By: Wilkie Lent M.D.   On: 05/30/2024 09:54     Procedures   Medications Ordered in the ED - No data to display                                  Medical Decision Making  This patient presents to the ED for concern of left leg pain, this involves an extensive number of treatment options, and is a complaint that carries with it a high risk of complications and morbidity.  The differential diagnosis includes dvt, msk   Co morbidities that complicate the patient evaluation  polymyalgia rheumatica, depression, GERD, and arthritis   Additional history obtained:  Additional history obtained from epic chart review External records from outside source obtained and reviewed including friend  Imaging Studies ordered:  I ordered imaging studies including leg US   I independently visualized and interpreted imaging which showed neg for DVT I agree with the radiologist interpretation  Medicines ordered and prescription drug management:  I have reviewed the patients home medicines and have made adjustments as needed   Test Considered:  us    Problem List /  ED Course:  Left leg swelling:  no DVT.  Sx are likely due to strain from having to use left leg more due to boot on right leg.  Pt encouraged to use compression socks. Floaters:  chronic issue.  She is given the number to ophthalmology to f/u.   Reevaluation:  After the interventions noted above, I reevaluated the patient and found that they have :improved   Social Determinants of Health:  Lives at home   Dispostion:  After consideration of the diagnostic results and the patients response to treatment,  I feel that the patent would benefit from discharge with outpatient f/u.       Final diagnoses:  Left leg swelling  Floaters in visual field, bilateral    ED Discharge Orders     None          Dean Clarity, MD 05/30/24 1028  "

## 2024-05-30 NOTE — ED Notes (Signed)
 Pt d/c instructions, medications, and follow-up care reviewed with pt. Pt verbalized understanding and had no further questions at time of d/c. Pt CA&Ox4, ambulatory, and in NAD at time of d/c

## 2024-06-07 ENCOUNTER — Other Ambulatory Visit: Payer: Self-pay | Admitting: Podiatry

## 2024-06-07 DIAGNOSIS — M7661 Achilles tendinitis, right leg: Secondary | ICD-10-CM
